# Patient Record
Sex: Female | Born: 1970 | Race: White | Hispanic: No | Marital: Married | Smoking: Never smoker
Health system: Southern US, Community
[De-identification: ages and names within clinical notes are randomized; demographics above are authoritative.]

## PROBLEM LIST (undated history)

## (undated) DIAGNOSIS — N390 Urinary tract infection, site not specified: Secondary | ICD-10-CM

## (undated) DIAGNOSIS — N83209 Unspecified ovarian cyst, unspecified side: Secondary | ICD-10-CM

## (undated) HISTORY — PX: TONSILLECTOMY: SUR1361

## (undated) HISTORY — PX: FRACTURE SURGERY: SHX138

## (undated) HISTORY — PX: WISDOM TOOTH EXTRACTION: SHX21

## (undated) HISTORY — PX: TUBAL LIGATION: SHX77

---

## 1997-12-25 ENCOUNTER — Other Ambulatory Visit: Admission: RE | Admit: 1997-12-25 | Discharge: 1997-12-25 | Payer: Self-pay | Admitting: Obstetrics and Gynecology

## 2000-03-18 ENCOUNTER — Inpatient Hospital Stay (HOSPITAL_COMMUNITY): Admission: AD | Admit: 2000-03-18 | Discharge: 2000-03-18 | Payer: Self-pay | Admitting: Obstetrics and Gynecology

## 2000-03-25 ENCOUNTER — Inpatient Hospital Stay (HOSPITAL_COMMUNITY): Admission: AD | Admit: 2000-03-25 | Discharge: 2000-03-27 | Payer: Self-pay | Admitting: Obstetrics and Gynecology

## 2001-05-24 ENCOUNTER — Other Ambulatory Visit: Admission: RE | Admit: 2001-05-24 | Discharge: 2001-05-24 | Payer: Self-pay | Admitting: Obstetrics and Gynecology

## 2002-01-07 ENCOUNTER — Encounter: Payer: Self-pay | Admitting: Obstetrics and Gynecology

## 2002-01-07 ENCOUNTER — Ambulatory Visit (HOSPITAL_COMMUNITY): Admission: RE | Admit: 2002-01-07 | Discharge: 2002-01-07 | Payer: Self-pay | Admitting: Obstetrics and Gynecology

## 2002-01-09 ENCOUNTER — Inpatient Hospital Stay (HOSPITAL_COMMUNITY): Admission: AD | Admit: 2002-01-09 | Discharge: 2002-01-11 | Payer: Self-pay | Admitting: Obstetrics & Gynecology

## 2002-08-31 ENCOUNTER — Emergency Department (HOSPITAL_COMMUNITY): Admission: EM | Admit: 2002-08-31 | Discharge: 2002-08-31 | Payer: Self-pay | Admitting: Emergency Medicine

## 2002-08-31 ENCOUNTER — Encounter: Payer: Self-pay | Admitting: Emergency Medicine

## 2004-03-04 ENCOUNTER — Other Ambulatory Visit: Admission: RE | Admit: 2004-03-04 | Discharge: 2004-03-04 | Payer: Self-pay | Admitting: Obstetrics & Gynecology

## 2004-03-05 ENCOUNTER — Other Ambulatory Visit: Admission: RE | Admit: 2004-03-05 | Discharge: 2004-03-05 | Payer: Self-pay | Admitting: Obstetrics & Gynecology

## 2004-08-07 ENCOUNTER — Other Ambulatory Visit: Admission: RE | Admit: 2004-08-07 | Discharge: 2004-08-07 | Payer: Self-pay | Admitting: Obstetrics & Gynecology

## 2004-10-06 ENCOUNTER — Encounter (INDEPENDENT_AMBULATORY_CARE_PROVIDER_SITE_OTHER): Payer: Self-pay | Admitting: Specialist

## 2004-10-06 ENCOUNTER — Inpatient Hospital Stay (HOSPITAL_COMMUNITY): Admission: AD | Admit: 2004-10-06 | Discharge: 2004-10-07 | Payer: Self-pay | Admitting: Obstetrics & Gynecology

## 2004-11-04 ENCOUNTER — Other Ambulatory Visit: Admission: RE | Admit: 2004-11-04 | Discharge: 2004-11-04 | Payer: Self-pay | Admitting: Obstetrics & Gynecology

## 2004-11-05 ENCOUNTER — Other Ambulatory Visit: Admission: RE | Admit: 2004-11-05 | Discharge: 2004-11-05 | Payer: Self-pay | Admitting: Obstetrics & Gynecology

## 2009-10-10 ENCOUNTER — Encounter: Admission: RE | Admit: 2009-10-10 | Discharge: 2009-10-10 | Payer: Self-pay | Admitting: Obstetrics & Gynecology

## 2010-08-25 ENCOUNTER — Encounter: Payer: Self-pay | Admitting: Obstetrics & Gynecology

## 2010-12-20 NOTE — Op Note (Signed)
NAMESHANLEY, FURLOUGH NO.:  [id number]   MEDICAL RECORD NO.:  [id number]          PATIENT TYPE:  INP   LOCATION:  9106                          FACILITY:  [facility]   PHYSICIAN:  Ilda Mori, M.D.   DATE OF BIRTH:  [birth year]   DATE OF PROCEDURE:  10/06/2004  DATE OF DISCHARGE:                                 OPERATIVE REPORT   PREOPERATIVE DIAGNOSIS:  Voluntary sterilization.   POSTOPERATIVE DIAGNOSIS:  Voluntary sterilization.   PROCEDURE:  Postpartum bilateral partial salpingectomy for sterilization.   SURGEON:  Ilda Mori, M.D.   ANESTHESIA:  Epidural.   ESTIMATED BLOOD LOSS:  Minimal.   FINDINGS:  Normal appearing tubes and ovaries.   SPECIMENS:  Portions of the right and left fallopian tube.   INDICATIONS FOR PROCEDURE:  This is a 40 year old gravida 5, para 5, who was  one-hour status post spontaneous vaginal delivery, who requested permanent  sterilization.  The failure rate of 2 per 1000 was explained to the patient  as well as the fact that this was a permanent procedure and that there were  non-permanent alternatives available to her.  With this understanding, the  patient elected to proceed.   DESCRIPTION OF PROCEDURE:  The patient was taken to the operating room where  the epidural anesthesia that had been placed for labor pain control was  reinjected for surgical anesthetic.  The abdomen was prepped and draped in  the usual sterile fashion.  A subumbilical transverse incision was made and  carried down to the fascia which was entered sharply and extended bluntly.  The peritoneum was entered bluntly.  The fallopian tube on the right was  grasped with a Babcock clamp, trace to its fimbriated end.  The isthmic-  ampullary junction was then elevated and a knuckle of tube was doubly  ligated at the base and excised for pathologic confirmation.  Identical  procedure was then carried out on the left fallopian tube.  Hemostasis was  noted to be  present.  The subumbilical fascia was closed with a running 0  Vicryl suture and the skin was closed with a subcuticular 4-0 Dexon suture.  The patient tolerated the procedure well and left the operating room in good  condition.      RK/MEDQ  D:  10/06/2004  T:  10/07/2004  Job:  [id number]

## 2010-12-20 NOTE — H&P (Signed)
[facility]  Patient:    Marilyn Holmes, Marilyn Holmes                   MRN: [id number] Adm. Date:  [id number] Attending:  Silverio Lay A                         History and Physical  REASON FOR ADMISSION:         Intrauterine pregnancy at 40 weeks and 4 days, spontaneous labor.  HISTORY OF PRESENT ILLNESS:   This is a 40 year old married white female, gravida 3, para 2, abortus 0, with a due date of March 21, 2000, being admitted at 40 weeks and 4 days for regular contractions which had started irregularly on March 24, 2000 around 11 p.m. and continued progressively to become really regular since 3 a.m. this morning.  She denies any symptoms of pregnancy-induced hypertension, reports good fetal activity; she also denies bleeding or leakage of fluid, but reports some bloody show.  Labor progressed rapidly and after epidural, patient went from 4 to 5 cm to completely dilated. Fetal heart rate is reviewed and reassuring.  PRENATAL COURSE:              Blood type A-positive.  RPR nonreactive.  HBsAg negative.  HIV nonreactive.  Pap smear normal.  Gonorrhea and Chlamydia negative.  Rubella titers equivocal.  Sixteen-week AFP within normal limits. Twenty-week ultrasound with a normal anatomy survey and an anterior placenta. Twenty-eight-week glucose tolerance test within normal limits. Thirty-five-week group B strep negative.  Prenatal course was otherwise uneventful besides a mild iron deficiency anemia treated with iron supplement.  ALLERGIES:                    No known drug allergies.  PAST MEDICAL HISTORY:         March 1991, spontaneous vaginal delivery of a female infant at 40 weeks weighing 7 pounds 5 ounces, no complication. October 1993, spontaneous vaginal delivery of a female infant at 41 weeks weighing 8 pounds 7 ounces, no complication.  History of abnormal Pap smear in May 1999.  FAMILY HISTORY:               Father with chronic hypertension  and strokes. Mother with chronic hypertension and history of deep venous thrombosis on birth control pills and adult-onset diabetes.  Mother with breast cancer diagnosed at age 78; also had stomach cancer.  Brother with chronic hypertension, type adult-onset diabetes, colon cancer diagnosed at age 77.  SOCIAL HISTORY:               Married, nonsmoker.  Works at [employer].  PHYSICAL EXAMINATION  GENERAL:                      Under epidural, comfortable, no acute distress.  VITAL SIGNS:                  Normal.  HEENT:                        Normal.  LUNGS:                        Clear.  HEART:                        Normal.  ABDOMEN:  Gravid and nontender.  Vertex presentation.  PELVIC:                       Vaginal exam:  Completely dilated, completely effaced, bulging bag of water, vertex presentation, station 0.  Artificial rupture of membranes leading to abundant clear amniotic fluid.  EXTREMITIES:                  Negative.  ASSESSMENT:                   Intrauterine pregnancy at 40 weeks and 4 days,                               imminent delivery.  PLAN:                         Spontaneous vaginal delivery expected. DD:  03/25/00 TD:  03/25/00 Job: [id number]

## 2011-08-15 ENCOUNTER — Inpatient Hospital Stay (HOSPITAL_COMMUNITY): Payer: BC Managed Care – PPO

## 2011-08-15 ENCOUNTER — Inpatient Hospital Stay (HOSPITAL_COMMUNITY)
Admission: AD | Admit: 2011-08-15 | Discharge: 2011-08-15 | Disposition: A | Discharge status: Home / Self Care | Payer: BC Managed Care – PPO | Source: Ambulatory Visit | Attending: Obstetrics and Gynecology | Admitting: Obstetrics and Gynecology

## 2011-08-15 ENCOUNTER — Encounter (HOSPITAL_COMMUNITY): Payer: Self-pay | Admitting: *Deleted

## 2011-08-15 DIAGNOSIS — N949 Unspecified condition associated with female genital organs and menstrual cycle: Secondary | ICD-10-CM | POA: Insufficient documentation

## 2011-08-15 HISTORY — DX: Unspecified ovarian cyst, unspecified side: N83.209

## 2011-08-15 HISTORY — DX: Urinary tract infection, site not specified: N39.0

## 2011-08-15 LAB — CBC
HCT: 34.8 % — ABNORMAL LOW (ref 36.0–46.0)
Hemoglobin: 11.8 g/dL — ABNORMAL LOW (ref 12.0–15.0)
MCH: 28.8 pg (ref 26.0–34.0)
MCHC: 33.9 g/dL (ref 30.0–36.0)
MCV: 84.9 fL (ref 78.0–100.0)
Platelets: 198 10*3/uL (ref 150–400)
RBC: 4.1 MIL/uL (ref 3.87–5.11)
RDW: 12.8 % (ref 11.5–15.5)
WBC: 8.7 10*3/uL (ref 4.0–10.5)

## 2011-08-15 LAB — URINALYSIS, ROUTINE W REFLEX MICROSCOPIC
Bilirubin Urine: NEGATIVE
Ketones, ur: NEGATIVE mg/dL
Leukocytes, UA: NEGATIVE
Nitrite: NEGATIVE
Urobilinogen, UA: 0.2 mg/dL (ref 0.0–1.0)
pH: 7 (ref 5.0–8.0)

## 2011-08-15 NOTE — ED Notes (Signed)
Has been having some tenderness with intercourse, esp if deep.   Same partner 18yrs.

## 2011-08-15 NOTE — Progress Notes (Addendum)
Last night after intercourse, had Internal pain- like  labor, shooting back toward rectum.  Now still having cramping.

## 2011-08-15 NOTE — ED Provider Notes (Signed)
Pt is a 41 year old white female who presents to the ER c/o severe pelvic pain which began after sex last pm. Pt had a similar occurrence six months ago. Pt has also been having trouble with recurrent BV. Pt uses a BTL for [facility]. Her periods are regular but heavy. She denies any discharge today. Has some pain with voiding. Bowel function is normal.  PE: VSSAF        Abd- soft, nontender, no masses, no guarding.        Pelvic- ext-wnl                    Vagina- scant discharge                    Cx- +cmt                     Uterus-tender, appears normal size and shape.                    Adnexa- no masses. IMP/ pelvic pain         Possible etiologies- ruptured ovarian cyst, std Plan/ Check ultrasound, cbc,ct/gc, ua          Cleocin 150 tid x 5

## 2011-08-15 NOTE — ED Notes (Signed)
Prescription for Cleocin 150mg  TID x5 days (as listed in MD documentation) called in to pt's pharmacy choice- [facility] as listed.

## 2011-08-15 NOTE — Progress Notes (Signed)
Pt

## 2011-08-16 LAB — GC/CHLAMYDIA PROBE AMP, GENITAL
Chlamydia, DNA Probe: NEGATIVE
GC Probe Amp, Genital: NEGATIVE

## 2013-01-18 ENCOUNTER — Other Ambulatory Visit: Payer: Self-pay

## 2014-06-05 ENCOUNTER — Encounter (HOSPITAL_COMMUNITY): Payer: Self-pay | Admitting: *Deleted

## 2015-08-16 ENCOUNTER — Emergency Department (HOSPITAL_COMMUNITY)
Admission: EM | Admit: 2015-08-16 | Discharge: 2015-08-16 | Disposition: A | Discharge status: Home / Self Care | Payer: BLUE CROSS/BLUE SHIELD | Attending: Emergency Medicine | Admitting: Emergency Medicine

## 2015-08-16 ENCOUNTER — Emergency Department (HOSPITAL_COMMUNITY): Payer: BLUE CROSS/BLUE SHIELD

## 2015-08-16 DIAGNOSIS — R509 Fever, unspecified: Secondary | ICD-10-CM | POA: Diagnosis not present

## 2015-08-16 DIAGNOSIS — Z8742 Personal history of other diseases of the female genital tract: Secondary | ICD-10-CM | POA: Insufficient documentation

## 2015-08-16 DIAGNOSIS — Z87440 Personal history of urinary (tract) infections: Secondary | ICD-10-CM | POA: Diagnosis not present

## 2015-08-16 DIAGNOSIS — R1011 Right upper quadrant pain: Secondary | ICD-10-CM | POA: Insufficient documentation

## 2015-08-16 LAB — CBC
HEMATOCRIT: 33.1 % — AB (ref 36.0–46.0)
HEMOGLOBIN: 11.1 g/dL — AB (ref 12.0–15.0)
MCH: 28.9 pg (ref 26.0–34.0)
MCHC: 33.5 g/dL (ref 30.0–36.0)
MCV: 86.2 fL (ref 78.0–100.0)
Platelets: 188 10*3/uL (ref 150–400)
RBC: 3.84 MIL/uL — AB (ref 3.87–5.11)
RDW: 13 % (ref 11.5–15.5)
WBC: 9.3 10*3/uL (ref 4.0–10.5)

## 2015-08-16 LAB — URINALYSIS, ROUTINE W REFLEX MICROSCOPIC
Bilirubin Urine: NEGATIVE
GLUCOSE, UA: NEGATIVE mg/dL
Hgb urine dipstick: NEGATIVE
Ketones, ur: NEGATIVE mg/dL
LEUKOCYTES UA: NEGATIVE
Nitrite: NEGATIVE
PH: 7.5 (ref 5.0–8.0)
Protein, ur: NEGATIVE mg/dL
SPECIFIC GRAVITY, URINE: 1.01 (ref 1.005–1.030)

## 2015-08-16 LAB — COMPREHENSIVE METABOLIC PANEL
ALT: 28 U/L (ref 14–54)
ANION GAP: 8 (ref 5–15)
AST: 22 U/L (ref 15–41)
Albumin: 3.8 g/dL (ref 3.5–5.0)
Alkaline Phosphatase: 93 U/L (ref 38–126)
BUN: 10 mg/dL (ref 6–20)
CHLORIDE: 105 mmol/L (ref 101–111)
CO2: 24 mmol/L (ref 22–32)
Calcium: 9 mg/dL (ref 8.9–10.3)
Creatinine, Ser: 0.7 mg/dL (ref 0.44–1.00)
GFR calc Af Amer: >60 mL/min (ref >60)
Glucose, Bld: 109 mg/dL — ABNORMAL HIGH (ref 65–99)
POTASSIUM: 3.7 mmol/L (ref 3.5–5.1)
Sodium: 137 mmol/L (ref 135–145)
Total Bilirubin: 0.5 mg/dL (ref 0.3–1.2)
Total Protein: 6.9 g/dL (ref 6.5–8.1)

## 2015-08-16 LAB — LIPASE, BLOOD: LIPASE: 23 U/L (ref 11–51)

## 2015-08-16 NOTE — ED Notes (Signed)
Brought patient back to room 

## 2015-08-16 NOTE — ED Provider Notes (Signed)
CSN: [id number]     Arrival date & time 08/16/15  1050 History  By signing my name below, I, Marilyn Holmes, attest that this documentation has been prepared under the direction and in the presence of Mohawk IndustriesJeff Ariston Grandison, PA-C. Electronically Signed: Tanda RockersMargaux Holmes, ED Scribe. 08/16/2015. 2:38 PM.   Chief Complaint  Patient presents with  . Abdominal Pain   The history is provided by the patient. No language interpreter was used.     HPI Comments: Marilyn Holmes is a 45 y.o. female who presents to the Emergency Department complaining of gradual onset, intermittent , cramping, RUQ pain x 2 days, worsening this morning. The pain is exacerbated with lying flat and stretching her abdomen. She cannot say if the pain is worse after eating foods because she has not paid much attention to it. She has been taking Advil and applying heat with some relief. The last time she took Advil was around 2 AM this morning (approximately 12.5 hours ago). Pt has never had these symptoms in the past. She does report that before her symptoms began she attempted to lift something heavy at work but was unable to due so because of the weight of the object. Pt woke up the next morning with the pain. She was seen at Urgent Care earlier today for her symptoms. Pt had a CXR done there with no acute findings and was told to come to the ED for further evaluation. She mentions having a fever of 100.2 at Urgent Care. She was not given anything for her fever but her temperature in the ED today is 98.9. Denies indigestion, nausea, vomiting, diarrhea, constipation, chest pain, shortness of breath, or any other associated symptoms.   Past Medical History  Diagnosis Date  . Urinary tract infection   . Ovarian cyst    Past Surgical History  Procedure Laterality Date  . Tubal ligation    . Fracture surgery      repair of arm fx (bilateral), fr nose  . Tonsillectomy    . Wisdom tooth extraction     Family History  Problem Relation Age of  Onset  . Anesthesia problems Neg Hx    Social History  Substance Use Topics  . Smoking status: Never Smoker   . Smokeless tobacco: Never Used  . Alcohol Use: Yes   OB History    Gravida Para Term Preterm AB TAB SAB Ectopic Multiple Living   5 5 5  0 0 0 0 0 0 5     Review of Systems  All other systems reviewed and are negative.  Allergies  Review of patient's allergies indicates no known allergies.  Home Medications   Prior to Admission medications   Medication Sig Start Date End Date Taking? Authorizing Provider  ibuprofen (ADVIL,MOTRIN) 600 MG tablet Take 600 mg by mouth every 6 (six) hours as needed. Patient took this medication for pain.    Historical Provider, MD  penicillin v potassium (VEETID) 500 MG tablet Take 500 mg by mouth 4 (four) times daily. Patient is using this medication for an extracted wisdom tooth.    Historical Provider, MD  Pseudoeph-Doxylamine-DM-APAP (NYQUIL PO) Take 5 mLs by mouth daily as needed. Patient was using this medication for cold.    Historical Provider, MD   Triage Vitals:  BP 126/87 mmHg  Pulse 91  Temp(Src) 98.9 F (37.2 C) (Oral)  Resp 20  Ht 5\' 9"  (1.753 m)  Wt 160 lb 4.8 oz (72.712 kg)  BMI 23.66 kg/m2  SpO2  100%   Physical Exam  Constitutional: She is oriented to person, place, and time. She appears well-developed and well-nourished. No distress.  HENT:  Head: Normocephalic and atraumatic.  Eyes: Conjunctivae and EOM are normal.  Neck: Neck supple. No tracheal deviation present.  Cardiovascular: Normal rate and regular rhythm.   Pulmonary/Chest: Effort normal. No respiratory distress.  Abdominal: Soft. She exhibits no distension. There is tenderness. There is no rebound and no guarding.  Very minor tenderness to palpation at the RUQ No Murphy's sign No signs of trauma No warmth or redness No masses felt No CVA tenderness  Musculoskeletal: Normal range of motion.  Neurological: She is alert and oriented to person, place,  and time.  Skin: Skin is warm and dry.  Psychiatric: She has a normal mood and affect. Her behavior is normal.  Nursing note and vitals reviewed.   ED Course  Procedures (including critical care time)  DIAGNOSTIC STUDIES: Oxygen Saturation is 100% on RA, normal by my interpretation.    COORDINATION OF CARE: 2:30 PM-Discussed treatment plan which includes referral to GI with pt at bedside and pt agreed to plan.   Labs Review Labs Reviewed  COMPREHENSIVE METABOLIC PANEL - Abnormal; Notable for the following:    Glucose, Bld 109 (*)    All other components within normal limits  CBC - Abnormal; Notable for the following:    RBC 3.84 (*)    Hemoglobin 11.1 (*)    HCT 33.1 (*)    All other components within normal limits  LIPASE, BLOOD  URINALYSIS, ROUTINE W REFLEX MICROSCOPIC (NOT AT [facility])    Imaging Review US Abdomen Limited  08/16/2015  CLINICAL DATA:  Two day history of right upper quadrant pain EXAM: US ABDOMEN LIMITED - RIGHT UPPER QUADRANT COMPARISON:  None. FINDINGS: Gallbladder: No gallstones or wall thickening visualized. There is no pericholecystic fluid. No sonographic Murphy sign noted by sonographer. Common bile duct: Diameter: 5 mm. There is no intrahepatic or extrahepatic biliary duct dilatation. Liver: There is a cyst in the medial segment of the left lobe of the liver measuring 2.8 x 2.4 x 2.3 cm. Within normal limits in parenchymal echogenicity. IMPRESSION: Cyst in left lobe of liver.  Study otherwise unremarkable. Electronically Signed   By: Bretta Bang III M.D.   On: 08/16/2015 13:57   I have personally reviewed and evaluated these images and lab results as part of my medical decision-making.   EKG Interpretation None      MDM   Final diagnoses:  Right upper quadrant pain   Labs: Lipase, CMP, CBC, and UA  Imaging: US Abdomen  Consults: None  Therapeutics: None  Discharge Meds:   Assessment/Plan:  45 year old female presents today with  right upper quadrant pain. Patient is afebrile, she has no signs of biliary involvement on ultrasound or laboratory analysis. She is nontoxic, tolerating fluids without difficulty. Uncertain if this is muscular in nature from patient's heavy lifting or biliary colic. Patient will be instructed to use ibuprofen or Tylenol as needed for discomfort, heat, stretching her abdomen, and follow up with gastroenterology next week if symptoms continue to persist. Patient was given strict return precautions the event new or worsening signs or symptoms present, she verbalized understanding and agreement to today's plan and had no further questions or concerns at the time of discharge. Patient incidentally had a small cyst on the left lobe of the liver, this was within normal limits in parenchymal echogenicity. Patient was made aware of these findings, and if  symptoms continue to persist she will need to relay this information on to her gastroenterologist.     I personally performed the services described in this documentation, which was scribed in my presence. The recorded information has been reviewed and is accurate.      Eyvonne Mechanic, PA-C 08/16/15 1446  Loren Racer, MD 08/16/15 [phone]

## 2015-08-16 NOTE — ED Notes (Signed)
Pt reports RUQ abdomainal pain hat has been ongoing for 2 days. Pt denies N/V. [facility]

## 2015-08-16 NOTE — Discharge Instructions (Signed)
Biliary Colic °Biliary colic is a pain in the upper abdomen. The pain: °· Is usually felt on the right side of the abdomen, but it may also be felt in the center of the abdomen, just below the breastbone (sternum). °· May spread back toward the right shoulder blade. °· May be steady or irregular. °· May be accompanied by nausea and vomiting. °Most of the time, the pain goes away in 1-5 hours. After the most intense pain passes, the abdomen may continue to ache mildly for about 24 hours. °Biliary colic is caused by a blockage in the bile duct. The bile duct is a pathway that carries bile--a liquid that helps to digest fats--from the gallbladder to the small intestine. Biliary colic usually occurs after eating, when the digestive system demands bile. The pain develops when muscle cells contract forcefully to try to move the blockage so that bile can get by. °HOME CARE INSTRUCTIONS °· Take medicines only as directed by your health care provider. °· Drink enough fluid to keep your urine clear or pale yellow. °· Avoid fatty, greasy, and fried foods. These kinds of foods increase your body's demand for bile. °· Avoid any foods that make your pain worse. °· Avoid overeating. °· Avoid having a large meal after fasting. °SEEK MEDICAL CARE IF: °· You develop a fever. °· Your pain gets worse. °· You vomit. °· You develop nausea that prevents you from eating and drinking. °SEEK IMMEDIATE MEDICAL CARE IF: °· You suddenly develop a fever and shaking chills. °· You develop a yellowish discoloration (jaundice) of: °¨ Skin. °¨ Whites of the eyes. °¨ Mucous membranes. °· You have continuous or severe pain that is not relieved with medicines. °· You have nausea and vomiting that is not relieved with medicines. °· You develop dizziness or you faint. °  °This information is not intended to replace advice given to you by your health care provider. Make sure you discuss any questions you have with your health care provider. °  °Document  Released: 12/22/2005 Document Revised: 12/05/2014 Document Reviewed: 05/02/2014 °Elsevier Interactive Patient Education ©2016 Elsevier Inc. ° °

## 2017-01-30 ENCOUNTER — Other Ambulatory Visit: Payer: Self-pay | Admitting: Obstetrics & Gynecology

## 2017-01-30 DIAGNOSIS — R928 Other abnormal and inconclusive findings on diagnostic imaging of breast: Secondary | ICD-10-CM

## 2017-02-03 ENCOUNTER — Other Ambulatory Visit: Payer: Self-pay | Admitting: Obstetrics & Gynecology

## 2017-02-03 ENCOUNTER — Ambulatory Visit
Admission: RE | Admit: 2017-02-03 | Discharge: 2017-02-03 | Disposition: A | Discharge status: Home / Self Care | Payer: BLUE CROSS/BLUE SHIELD | Source: Ambulatory Visit | Attending: Obstetrics & Gynecology | Admitting: Obstetrics & Gynecology

## 2017-02-03 ENCOUNTER — Encounter (INDEPENDENT_AMBULATORY_CARE_PROVIDER_SITE_OTHER): Payer: Self-pay

## 2017-02-03 DIAGNOSIS — N6320 Unspecified lump in the left breast, unspecified quadrant: Secondary | ICD-10-CM

## 2017-02-03 DIAGNOSIS — R928 Other abnormal and inconclusive findings on diagnostic imaging of breast: Secondary | ICD-10-CM

## 2017-02-06 ENCOUNTER — Ambulatory Visit
Admission: RE | Admit: 2017-02-06 | Discharge: 2017-02-06 | Disposition: A | Discharge status: Home / Self Care | Payer: BLUE CROSS/BLUE SHIELD | Source: Ambulatory Visit | Attending: Obstetrics & Gynecology | Admitting: Obstetrics & Gynecology

## 2017-02-06 ENCOUNTER — Other Ambulatory Visit: Payer: Self-pay | Admitting: Obstetrics & Gynecology

## 2017-02-06 DIAGNOSIS — N6320 Unspecified lump in the left breast, unspecified quadrant: Secondary | ICD-10-CM

## 2017-02-09 HISTORY — PX: BREAST BIOPSY: SHX20

## 2017-02-18 ENCOUNTER — Emergency Department (HOSPITAL_COMMUNITY): Payer: BLUE CROSS/BLUE SHIELD

## 2017-02-18 ENCOUNTER — Encounter (HOSPITAL_COMMUNITY): Payer: Self-pay | Admitting: Emergency Medicine

## 2017-02-18 ENCOUNTER — Observation Stay (HOSPITAL_COMMUNITY)
Admission: EM | Admit: 2017-02-18 | Discharge: 2017-02-19 | Disposition: A | Discharge status: Home / Self Care | Payer: BLUE CROSS/BLUE SHIELD | Attending: Internal Medicine | Admitting: Internal Medicine

## 2017-02-18 DIAGNOSIS — R609 Edema, unspecified: Secondary | ICD-10-CM | POA: Diagnosis not present

## 2017-02-18 DIAGNOSIS — I10 Essential (primary) hypertension: Secondary | ICD-10-CM | POA: Insufficient documentation

## 2017-02-18 DIAGNOSIS — I159 Secondary hypertension, unspecified: Secondary | ICD-10-CM | POA: Diagnosis not present

## 2017-02-18 DIAGNOSIS — W57XXXA Bitten or stung by nonvenomous insect and other nonvenomous arthropods, initial encounter: Secondary | ICD-10-CM | POA: Diagnosis not present

## 2017-02-18 DIAGNOSIS — T63461A Toxic effect of venom of wasps, accidental (unintentional), initial encounter: Secondary | ICD-10-CM | POA: Diagnosis not present

## 2017-02-18 DIAGNOSIS — L0390 Cellulitis, unspecified: Secondary | ICD-10-CM | POA: Diagnosis present

## 2017-02-18 DIAGNOSIS — L03114 Cellulitis of left upper limb: Secondary | ICD-10-CM | POA: Insufficient documentation

## 2017-02-18 DIAGNOSIS — L530 Toxic erythema: Secondary | ICD-10-CM | POA: Diagnosis not present

## 2017-02-18 LAB — CBC WITH DIFFERENTIAL/PLATELET
Basophils Absolute: 0 10*3/uL (ref 0.0–0.1)
Basophils Relative: 0 %
Eosinophils Absolute: 0.2 10*3/uL (ref 0.0–0.7)
Eosinophils Relative: 3 %
HEMATOCRIT: 38.2 % (ref 36.0–46.0)
HEMOGLOBIN: 12.6 g/dL (ref 12.0–15.0)
LYMPHS ABS: 1.8 10*3/uL (ref 0.7–4.0)
LYMPHS PCT: 24 %
MCH: 27 pg (ref 26.0–34.0)
MCHC: 33 g/dL (ref 30.0–36.0)
MCV: 82 fL (ref 78.0–100.0)
MONOS PCT: 7 %
Monocytes Absolute: 0.6 10*3/uL (ref 0.1–1.0)
NEUTROS PCT: 66 %
Neutro Abs: 4.9 10*3/uL (ref 1.7–7.7)
Platelets: 197 10*3/uL (ref 150–400)
RBC: 4.66 MIL/uL (ref 3.87–5.11)
RDW: 14.5 % (ref 11.5–15.5)
WBC: 7.6 10*3/uL (ref 4.0–10.5)

## 2017-02-18 LAB — BASIC METABOLIC PANEL
Anion gap: 8 (ref 5–15)
BUN: 19 mg/dL (ref 6–20)
CHLORIDE: 105 mmol/L (ref 101–111)
CO2: 26 mmol/L (ref 22–32)
Calcium: 9.2 mg/dL (ref 8.9–10.3)
Creatinine, Ser: 0.76 mg/dL (ref 0.44–1.00)
GFR calc non Af Amer: >60 mL/min (ref >60)
Glucose, Bld: 102 mg/dL — ABNORMAL HIGH (ref 65–99)
POTASSIUM: 3.8 mmol/L (ref 3.5–5.1)
Sodium: 139 mmol/L (ref 135–145)

## 2017-02-18 LAB — LACTIC ACID, PLASMA: Lactic Acid, Venous: 1.2 mmol/L (ref 0.5–1.9)

## 2017-02-18 MED ORDER — IBUPROFEN 400 MG PO TABS
400.0000 mg | ORAL_TABLET | Freq: Four times a day (QID) | ORAL | Status: DC
  Administered 2017-02-18 – 2017-02-19 (×4): 400 mg via ORAL
  Filled 2017-02-18 (×4): qty 1

## 2017-02-18 MED ORDER — TETANUS-DIPHTH-ACELL PERTUSSIS 5-2.5-18.5 LF-MCG/0.5 IM SUSP
0.5000 mL | Freq: Once | INTRAMUSCULAR | Status: AC
  Administered 2017-02-18: 0.5 mL via INTRAMUSCULAR
  Filled 2017-02-18: qty 0.5

## 2017-02-18 MED ORDER — PANTOPRAZOLE SODIUM 40 MG PO TBEC
40.0000 mg | DELAYED_RELEASE_TABLET | Freq: Every day | ORAL | Status: DC
  Administered 2017-02-18 – 2017-02-19 (×2): 40 mg via ORAL
  Filled 2017-02-18 (×2): qty 1

## 2017-02-18 MED ORDER — DEXTROSE 5 % IV SOLN
1.0000 g | INTRAVENOUS | Status: DC
  Administered 2017-02-18: 1 g via INTRAVENOUS
  Filled 2017-02-18 (×3): qty 10

## 2017-02-18 MED ORDER — SODIUM CHLORIDE 0.9 % IV SOLN
INTRAVENOUS | Status: DC
  Administered 2017-02-18: 11:00:00 via INTRAVENOUS

## 2017-02-18 MED ORDER — ACETAMINOPHEN 650 MG RE SUPP: 650.0000 mg | Freq: Four times a day (QID) | RECTAL | Status: DC | PRN

## 2017-02-18 MED ORDER — CRANBERRY/PROBIOTIC/VIT C 450-30 MG PO TABS: ORAL_TABLET | Freq: Every day | ORAL | Status: DC

## 2017-02-18 MED ORDER — ENOXAPARIN SODIUM 40 MG/0.4ML ~~LOC~~ SOLN
40.0000 mg | SUBCUTANEOUS | Status: DC
  Administered 2017-02-18: 40 mg via SUBCUTANEOUS
  Filled 2017-02-18: qty 0.4

## 2017-02-18 MED ORDER — ONDANSETRON HCL 4 MG/2ML IJ SOLN: 4.0000 mg | Freq: Four times a day (QID) | INTRAMUSCULAR | Status: DC | PRN

## 2017-02-18 MED ORDER — DIPHENHYDRAMINE HCL 25 MG PO CAPS
25.0000 mg | ORAL_CAPSULE | Freq: Three times a day (TID) | ORAL | Status: DC
  Administered 2017-02-18 – 2017-02-19 (×3): 25 mg via ORAL
  Filled 2017-02-18 (×3): qty 1

## 2017-02-18 MED ORDER — ONDANSETRON HCL 4 MG PO TABS: 4.0000 mg | ORAL_TABLET | Freq: Four times a day (QID) | ORAL | Status: DC | PRN

## 2017-02-18 MED ORDER — RISAQUAD PO CAPS
1.0000 | ORAL_CAPSULE | Freq: Every day | ORAL | Status: DC
  Administered 2017-02-18 – 2017-02-19 (×2): 1 via ORAL
  Filled 2017-02-18 (×2): qty 1

## 2017-02-18 MED ORDER — ACETAMINOPHEN 325 MG PO TABS
650.0000 mg | ORAL_TABLET | Freq: Four times a day (QID) | ORAL | Status: DC | PRN
Start: 2017-02-18 — End: 2017-02-19

## 2017-02-18 MED ORDER — CLINDAMYCIN PHOSPHATE 600 MG/50ML IV SOLN
600.0000 mg | Freq: Once | INTRAVENOUS | Status: AC
  Administered 2017-02-18: 600 mg via INTRAVENOUS
  Filled 2017-02-18: qty 50

## 2017-02-18 NOTE — ED Provider Notes (Signed)
[facility]-EMERGENCY DEPT Provider Note   CSN: [id number] Arrival date & time: 02/18/17  [id number]     History   Chief Complaint Chief Complaint  Patient presents with  . Insect Bite    HPI Marilyn Holmes is a 46 y.o. female.  HPI  Pt was seen at 0725. Per pt, c/o gradual onset and worsening of persistent left hand "swelling" for the past 2 days. Has been associated with "red rash." States her symptoms began after she was "stung by 4 wasps" 2 days ago. Pt states initially her left dorsal hand was "red and swollen" and today the swelling and redness is streaking up her left arm to her elbow. Denies fevers, no focal motor weakness, no tinging/numbness in extremities, no open wounds.    Pt is right handed Td unk Past Medical History:  Diagnosis Date  . Ovarian cyst   . Urinary tract infection     There are no active problems to display for this patient.   Past Surgical History:  Procedure Laterality Date  . FRACTURE SURGERY     repair of arm fx (bilateral), fr nose  . TONSILLECTOMY    . TUBAL LIGATION    . WISDOM TOOTH EXTRACTION      OB History    Gravida Para Term Preterm AB Living   5 5 5  0 0 5   SAB TAB Ectopic Multiple Live Births   0 0 0 0         Home Medications    Prior to Admission medications   Medication Sig Start Date End Date Taking? Authorizing Provider  ibuprofen (ADVIL,MOTRIN) 600 MG tablet Take 600 mg by mouth every 6 (six) hours as needed. Patient took this medication for pain.    [provider]  penicillin v potassium (VEETID) 500 MG tablet Take 500 mg by mouth 4 (four) times daily. Patient is using this medication for an extracted wisdom tooth.    [provider]  Pseudoeph-Doxylamine-DM-APAP (NYQUIL PO) Take 5 mLs by mouth daily as needed. Patient was using this medication for cold.    [provider]    Family History Family History  Problem Relation Age of Onset  . Anesthesia problems Neg Hx     Social  History Social History  Substance Use Topics  . Smoking status: Never Smoker  . Smokeless tobacco: Never Used  . Alcohol use Yes     Comment: weekend     Allergies   Patient has no known allergies.   Review of Systems Review of Systems ROS: Statement: All systems negative except as marked or noted in the HPI; Constitutional: Negative for fever and chills. ; ; Eyes: Negative for eye pain, redness and discharge. ; ; ENMT: Negative for ear pain, hoarseness, nasal congestion, sinus pressure and sore throat. ; ; Cardiovascular: Negative for chest pain, palpitations, diaphoresis, dyspnea and peripheral edema. ; ; Respiratory: Negative for cough, wheezing and stridor. ; ; Gastrointestinal: Negative for nausea, vomiting, diarrhea, abdominal pain, blood in stool, hematemesis, jaundice and rectal bleeding. . ; ; Genitourinary: Negative for dysuria, flank pain and hematuria. ; ; Musculoskeletal: Negative for back pain and neck pain. Negative for swelling and trauma.; ; Skin: +left hand swelling, rash. Negative for pruritus, abrasions, bruising and skin lesion.; ; Neuro: Negative for headache, lightheadedness and neck stiffness. Negative for weakness, altered level of consciousness, altered mental status, extremity weakness, paresthesias, involuntary movement, seizure and syncope.       Physical Exam Updated Vital Signs BP  125/90 (BP Location: Left Arm)   Pulse 77   Temp 97.7 F (36.5 C) (Oral)   Resp 18   Ht 5\' 9"  (1.753 m)   Wt 74.8 kg (165 lb)   LMP 02/16/2017   SpO2 100%   BMI 24.37 kg/m   Physical Exam 0730: Physical examination:  Nursing notes reviewed; Vital signs and O2 SAT reviewed;  Constitutional: Well developed, Well nourished, Well hydrated, In no acute distress; Head:  Normocephalic, atraumatic; Eyes: EOMI, PERRL, No scleral icterus; ENMT: Mouth and pharynx normal, Mucous membranes moist; Neck: Supple, Full range of motion, No lymphadenopathy; Cardiovascular: Regular rate and  rhythm, No gallop; Respiratory: Breath sounds clear & equal bilaterally, No wheezes.  Speaking full sentences with ease, Normal respiratory effort/excursion; Chest: Nontender, Movement normal; Abdomen: Soft, Nontender, Nondistended, Normal bowel sounds; Genitourinary: No CVA tenderness; Extremities: Pulses normal, No deformity. +left dorsal hand edema extending to dorsal wrist, including proximal index finger, with erythema streaking up left dorsal-medial forearm to elbow with one small blister. No open wounds, no drainage, no ecchymosis. No palp fluctuance..; Neuro: AA&Ox3, Major CN grossly intact.  Speech clear. No gross focal motor or sensory deficits in extremities.; Skin: Color normal, Warm, Dry.   ED Treatments / Results  Labs (all labs ordered are listed, but only abnormal results are displayed)   EKG  EKG Interpretation None       Radiology   Procedures Procedures (including critical care time)  Medications Ordered in ED Medications  clindamycin (CLEOCIN) IVPB 600 mg (0 mg Intravenous Stopped 02/18/17 0852)     Initial Impression / Assessment and Plan / ED Course  I have reviewed the triage vital signs and the nursing notes.  Pertinent labs & imaging results that were available during my care of the patient were reviewed by me and considered in my medical decision making (see chart for details).  MDM Reviewed: previous chart, nursing note and vitals Reviewed previous: labs Interpretation: labs and x-ray   Results for orders placed or performed during the hospital encounter of 02/18/17  Basic metabolic panel  Result Value Ref Range   Sodium 139 135 - 145 mmol/L   Potassium 3.8 3.5 - 5.1 mmol/L   Chloride 105 101 - 111 mmol/L   CO2 26 22 - 32 mmol/L   Glucose, Bld 102 (H) 65 - 99 mg/dL   BUN 19 6 - 20 mg/dL   Creatinine, Ser 1.61 0.44 - 1.00 mg/dL   Calcium 9.2 8.9 - 09.6 mg/dL   GFR calc non Af Amer >60 >60 mL/min   GFR calc Af Amer >60 >60 mL/min   Anion gap  8 5 - 15  CBC with Differential  Result Value Ref Range   WBC 7.6 4.0 - 10.5 K/uL   RBC 4.66 3.87 - 5.11 MIL/uL   Hemoglobin 12.6 12.0 - 15.0 g/dL   HCT 04.5 40.9 - 81.1 %   MCV 82.0 78.0 - 100.0 fL   MCH 27.0 26.0 - 34.0 pg   MCHC 33.0 30.0 - 36.0 g/dL   RDW 91.4 78.2 - 95.6 %   Platelets 197 150 - 400 K/uL   Neutrophils Relative % 66 %   Neutro Abs 4.9 1.7 - 7.7 K/uL   Lymphocytes Relative 24 %   Lymphs Abs 1.8 0.7 - 4.0 K/uL   Monocytes Relative 7 %   Monocytes Absolute 0.6 0.1 - 1.0 K/uL   Eosinophils Relative 3 %   Eosinophils Absolute 0.2 0.0 - 0.7 K/uL   Basophils  Relative 0 %   Basophils Absolute 0.0 0.0 - 0.1 K/uL  Lactic acid, plasma  Result Value Ref Range   Lactic Acid, Venous 1.2 0.5 - 1.9 mmol/L   Dg Hand Complete Left Result Date: 02/18/2017 CLINICAL DATA:  Left hand swelling after wasp sting 02/16/2017. Initial encounter. EXAM: LEFT HAND - COMPLETE 3+ VIEW COMPARISON:  None. FINDINGS: Generalized soft tissue swelling. No soft tissue emphysema, opaque foreign body, or osseous abnormality. IMPRESSION: Soft tissue swelling without emphysema, opaque foreign body, or osseous abnormality. Electronically Signed   By: Marnee Spring M.D.   On: 02/18/2017 08:07    0900:  IV clindamycin given. Will update Td. Dx and testing d/w pt.  Questions answered.  Verb understanding, agreeable to observation admit.   T/C to Ortho Dr. Romeo Apple, case discussed, including:  HPI, pertinent PM/SHx, VS/PE, dx testing, ED course and treatment:  Agreeable to consult.   0915:  T/C to [facility] Dr. Ella Jubilee, case discussed, including:  HPI, pertinent PM/SHx, VS/PE, dx testing, ED course and treatment:  Agreeable to admit.   Final Clinical Impressions(s) / ED Diagnoses   Final diagnoses:  None    New Prescriptions New Prescriptions   No medications on file     Samuel Jester, DO 02/23/17 1520

## 2017-02-18 NOTE — H&P (Signed)
History and Physical    Marilyn Holmes ZOX:[id number] DOB: [birth year] DOA: 02/18/2017  PCP: Ilda Mori, MD   Patient coming from: Home   Chief Complaint:  Left hand edema and pain.   HPI: Marilyn Holmes is a 46 y.o. female with no significant medical history, presents with left hand edema. Her symptoms started about 48 hours ago, triggered by wasp stung, are on her index finger on left hand. Her edema has been worsening and expanding into her proximal forearm, associated with erythema and tenderness, worse with movement and touch, no improving factors. The edema has been severe in intensity. Patient tried ibuprofen and Benadryl last night with no significant improvement of her symptoms. Due to persistent edema she presented to the hospital for further evaluation.  ED Course: Patient was evaluated, she received antibiotics, referred for further admission.  Review of Systems: 1. General. No fevers or chills, no waking a weight loss 2. ENT no runny nose or sore throat 3. Pulmonary no shortness of breath cough or hemoptysis 4. Cardiovascular no angina, no claudication no PND orthopnea 5. Gastrointestinal no nausea vomiting or diarrhea 6. Most skeletal no joint pain, positive pain at the left wrist as mentioned in history present illness 7. Hematology no easy bruisability or frequent infections 8. Urology no dysuria or increased urinary frequency 9. Neurology no seizures or paresthesias 10. Endocrine no tremors, heat or cold intolerance  Past Medical History:  Diagnosis Date  . Ovarian cyst   . Urinary tract infection     Past Surgical History:  Procedure Laterality Date  . FRACTURE SURGERY     repair of arm fx (bilateral), fr nose  . TONSILLECTOMY    . TUBAL LIGATION    . WISDOM TOOTH EXTRACTION       reports that she has never smoked. She has never used smokeless tobacco. She reports that she drinks alcohol. She reports that she does not use drugs.  No Known  Allergies  Family History  Problem Relation Age of Onset  . Anesthesia problems Neg Hx      Prior to Admission medications   Medication Sig Start Date End Date Taking? Authorizing Provider  Cranberry-Vit C-Lactobacillus (CRANBERRY/PROBIOTIC/VIT C PO) Take 1 tablet by mouth daily.   Yes [provider]  ibuprofen (ADVIL,MOTRIN) 200 MG tablet Take 200 mg by mouth daily as needed for mild pain.   Yes [provider]    Physical Exam: Vitals:   02/18/17 0640 02/18/17 0938  BP: 125/90 (!) 136/91  Pulse: 77 70  Resp: 18 18  Temp: 97.7 F (36.5 C) 97.7 F (36.5 C)  TempSrc: Oral   SpO2: 100% 100%  Weight: 74.8 kg (165 lb)   Height: 5\' 9"  (1.753 m)     Constitutional: not in pain or dyspnea Vitals:   02/18/17 0640 02/18/17 0938  BP: 125/90 (!) 136/91  Pulse: 77 70  Resp: 18 18  Temp: 97.7 F (36.5 C) 97.7 F (36.5 C)  TempSrc: Oral   SpO2: 100% 100%  Weight: 74.8 kg (165 lb)   Height: 5\' 9"  (1.753 m)    Eyes: PERRL, lids and conjunctivae normal Head: normocephalic Nose and ears: no deformities.  ENMT: Mucous membranes are moist. Posterior pharynx clear of any exudate or lesions.Normal dentition.  Neck: normal, supple, no masses, no thyromegaly Respiratory: clear to auscultation bilaterally, no wheezing, no crackles. Normal respiratory effort. No accessory muscle use.  Cardiovascular: Regular rate and rhythm, no murmurs / rubs / gallops. No extremity edema.  2+ pedal pulses. No carotid bruits.  Abdomen: no tenderness, no masses palpated. No hepatosplenomegaly. Bowel sounds positive.  Musculoskeletal: no clubbing / cyanosis. No joint deformity upper and lower extremities. Good ROM, no contractures. Normal muscle tone.  Left upper extremity, with edema and erythema at the dorsal region of the wrist and hand, with decreased range of motion to extension or fingers abduction. Erythema and edema extending into her distal forearm on the left Skin: no rashes,  lesions, ulcers. No induration Neurologic: CN 2-12 grossly intact. Sensation intact, DTR normal. Strength 5/5 in all 4.   Labs on Admission: I have personally reviewed following labs and imaging studies  CBC:  Recent Labs Lab 02/18/17 0752  WBC 7.6  NEUTROABS 4.9  HGB 12.6  HCT 38.2  MCV 82.0  PLT 197   Basic Metabolic Panel:  Recent Labs Lab 02/18/17 0752  NA 139  K 3.8  CL 105  CO2 26  GLUCOSE 102*  BUN 19  CREATININE 0.76  CALCIUM 9.2   GFR: Estimated Creatinine Clearance: 92.8 mL/min (by C-G formula based on SCr of 0.76 mg/dL). Liver Function Tests: No results for input(s): AST, ALT, ALKPHOS, BILITOT, PROT, ALBUMIN in the last 168 hours. No results for input(s): LIPASE, AMYLASE in the last 168 hours. No results for input(s): AMMONIA in the last 168 hours. Coagulation Profile: No results for input(s): INR, PROTIME in the last 168 hours. Cardiac Enzymes: No results for input(s): CKTOTAL, CKMB, CKMBINDEX, TROPONINI in the last 168 hours. BNP (last 3 results) No results for input(s): PROBNP in the last 8760 hours. HbA1C: No results for input(s): HGBA1C in the last 72 hours. CBG: No results for input(s): GLUCAP in the last 168 hours. Lipid Profile: No results for input(s): CHOL, HDL, LDLCALC, TRIG, CHOLHDL, LDLDIRECT in the last 72 hours. Thyroid Function Tests: No results for input(s): TSH, T4TOTAL, FREET4, T3FREE, THYROIDAB in the last 72 hours. Anemia Panel: No results for input(s): VITAMINB12, FOLATE, FERRITIN, TIBC, IRON, RETICCTPCT in the last 72 hours. Urine analysis:    Component Value Date/Time   COLORURINE YELLOW 08/16/2015 1121   APPEARANCEUR CLEAR 08/16/2015 1121   LABSPEC 1.010 08/16/2015 1121   PHURINE 7.5 08/16/2015 1121   GLUCOSEU NEGATIVE 08/16/2015 1121   HGBUR NEGATIVE 08/16/2015 1121   BILIRUBINUR NEGATIVE 08/16/2015 1121   KETONESUR NEGATIVE 08/16/2015 1121   PROTEINUR NEGATIVE 08/16/2015 1121   UROBILINOGEN 0.2 08/15/2011 1546    NITRITE NEGATIVE 08/16/2015 1121   LEUKOCYTESUR NEGATIVE 08/16/2015 1121    Radiological Exams on Admission: Dg Hand Complete Left  Result Date: 02/18/2017 CLINICAL DATA:  Left hand swelling after wasp sting 02/16/2017. Initial encounter. EXAM: LEFT HAND - COMPLETE 3+ VIEW COMPARISON:  None. FINDINGS: Generalized soft tissue swelling. No soft tissue emphysema, opaque foreign body, or osseous abnormality. IMPRESSION: Soft tissue swelling without emphysema, opaque foreign body, or osseous abnormality. Electronically Signed   By: Marnee Spring M.D.   On: 02/18/2017 08:07    EKG: Independently reviewed. NA  Assessment/Plan Active Problems:   Cellulitis   This is a 46 year old female with no significant past medical history who experienced wasp stung about 48 hours ago on her left wrist, developing severe erythema, edema, pain and decreased range of motion. No other systemic symptoms. Try low-dose ibuprofen and Benadryl with no major improvement. Physical examination temperature 97.6, blood pressure 143/91, heart rate 58, respiratory rate 18, oxygen saturation 100% room air. Her oral mucosa was moist, her lungs were clear to auscultation bilaterally, heart S1-S2 present rhythmic, the  abdomen was soft nontender, lower extremities with no edema, her left wrist with positive edema, erythema, tender to palpation, decreased range of motion, no purulence. Sodium 138, potassium 3.8, chloride 105, bicarbonate 26, glucose 102, BUN 19, creatinine 0.76, white count 7.6, hemoglobin 12.6, hematocrit 38.2, platelets 197. Left wrist x-ray with soft tissue swelling without emphysema.   Working diagnosis left wrist erythema, edema, tenderness due to insect bite (wasp) to rule out bacterial superinfection.   1. Left wrist edema and erythema due to insect bite, rule out bacterial superinfection. Patient will be admitted under observation, will place on gentle maintenance IV fluids, anti-inflammatory therapy with  ibuprofen, antihistamine with Benadryl, will start patient on ceftriaxone intravenously. There is no signs of active joint involvement or infection. Will need close observation over the next 24 hours.  2. Elevated blood pressure. Likely reactive due to acute illness, we'll continue close monitoring. Will need outpatient follow-up.   DVT prophylaxis: enoxaparin  Code Status:  Full Family Communication: No family at bedside  Disposition Plan: Home  Consults called: Admission status: Observation  Marilyn Holmes Annett Gulaaniel Georganna Maxson MD [facility] Pager [phone]- [phone]  If 7PM-7AM, please contact night-coverage [email or web address] Password [email or web address]  02/18/2017, 10:09 AM

## 2017-02-18 NOTE — ED Notes (Signed)
Left hand edema and erythema extending into left forearm.

## 2017-02-18 NOTE — ED Triage Notes (Signed)
Pt states she was stung by at least 4 wasps on left hand.  Having swelling and redness with blisters in hand that is progressing down arm

## 2017-02-19 DIAGNOSIS — T63461A Toxic effect of venom of wasps, accidental (unintentional), initial encounter: Principal | ICD-10-CM

## 2017-02-19 LAB — CBC
HCT: 32 % — ABNORMAL LOW (ref 36.0–46.0)
Hemoglobin: 10.6 g/dL — ABNORMAL LOW (ref 12.0–15.0)
MCH: 27.2 pg (ref 26.0–34.0)
MCHC: 33.1 g/dL (ref 30.0–36.0)
MCV: 82.3 fL (ref 78.0–100.0)
Platelets: 170 10*3/uL (ref 150–400)
RBC: 3.89 MIL/uL (ref 3.87–5.11)
RDW: 14.4 % (ref 11.5–15.5)
WBC: 6.4 10*3/uL (ref 4.0–10.5)

## 2017-02-19 LAB — HIV ANTIBODY (ROUTINE TESTING W REFLEX): HIV SCREEN 4TH GENERATION: NONREACTIVE

## 2017-02-19 LAB — COMPREHENSIVE METABOLIC PANEL
ALBUMIN: 3.3 g/dL — AB (ref 3.5–5.0)
ALK PHOS: 66 U/L (ref 38–126)
ALT: 30 U/L (ref 14–54)
AST: 23 U/L (ref 15–41)
Anion gap: 7 (ref 5–15)
BILIRUBIN TOTAL: 0.5 mg/dL (ref 0.3–1.2)
BUN: 15 mg/dL (ref 6–20)
CALCIUM: 8.2 mg/dL — AB (ref 8.9–10.3)
CO2: 23 mmol/L (ref 22–32)
CREATININE: 0.67 mg/dL (ref 0.44–1.00)
Chloride: 107 mmol/L (ref 101–111)
GFR calc Af Amer: >60 mL/min (ref >60)
GFR calc non Af Amer: >60 mL/min (ref >60)
GLUCOSE: 104 mg/dL — AB (ref 65–99)
Potassium: 4 mmol/L (ref 3.5–5.1)
Sodium: 137 mmol/L (ref 135–145)
TOTAL PROTEIN: 5.8 g/dL — AB (ref 6.5–8.1)

## 2017-02-19 MED ORDER — IBUPROFEN 400 MG PO TABS: 400.0000 mg | ORAL_TABLET | Freq: Three times a day (TID) | ORAL | 0 refills | Status: AC | PRN

## 2017-02-19 NOTE — Discharge Summary (Signed)
Physician Discharge Summary  Marilyn Holmes ZOX:[id number] DOB: [birth year] DOA: 02/18/2017  PCP: Ilda Mori, MD  Admit date: 02/18/2017 Discharge date: 02/19/2017  Admitted From: Home  Disposition:  Home   Recommendations for Outpatient Follow-up:  1. Follow up with PCP in 1-week 2. Take ibuprofen as needed  Home Health: No  Equipment/Devices: No   Discharge Condition: Stable  CODE STATUS: Full  Diet recommendation: Regular   Brief/Interim Summary: This is a 46 year old female with no significant past medical history who experienced wasp stung about 48 hours ago on her left wrist, developing severe erythema, edema, pain and decreased range of motion. No other systemic symptoms. She tryed low-dose ibuprofen and Benadryl with no major improvement. Physical examination temperature 97.6, blood pressure 143/91, heart rate 58, respiratory rate 18, oxygen saturation 100% room air. Her oral mucosa was moist, her lungs were clear to auscultation bilaterally, heart S1-S2 present rhythmic, the abdomen was soft nontender, lower extremities with no edema, her left wrist with positive edema, erythema, tender to palpation, decreased range of motion, no purulence. Sodium 138, potassium 3.8, chloride 105, bicarbonate 26, glucose 102, BUN 19, creatinine 0.76, white count 7.6, hemoglobin 12.6, hematocrit 38.2, platelets 197. Left wrist x-ray with soft tissue swelling without emphysema.   Working diagnosis left wrist erythema, edema, tenderness due to insect stung (wasp) to rule out bacterial superinfection.  1. Left wrist/ hand inflammation due to insect stung. Patient was admitted to the medical floor, she was placed on IV fluids and antibiotics, with IV ceftriaxone. Anti-inflammatory regimen with ibuprofen 400 mg every 6 hours and antihistamines with Benadryl. Patient had a remarkable improvement of her inflammatory changes. Erythema completely resolved, edema with significant improvement. Patient remained  afebrile, no leukocytosis. The antibiotics will be discontinued, very unlikely to have a bacterial superinfection. Will recommend follow-up as an outpatient within 7 days. Take ibuprofen as needed.  2. Elevated blood pressure. Due to acute illness, discharge blood pressure 121/72   Discharge Diagnoses:  Active Problems:   Cellulitis    Discharge Instructions   Allergies as of 02/19/2017   No Known Allergies     Medication List    TAKE these medications   CRANBERRY/PROBIOTIC/VIT C PO Take 1 tablet by mouth daily.   ibuprofen 400 MG tablet Commonly known as:  ADVIL,MOTRIN Take 1 tablet (400 mg total) by mouth every 8 (eight) hours as needed (as needed for pain.). What changed:  medication strength  how much to take  when to take this  reasons to take this       No Known Allergies  Consultations:     Procedures/Studies: Dg Hand Complete Left  Result Date: 02/18/2017 CLINICAL DATA:  Left hand swelling after wasp sting 02/16/2017. Initial encounter. EXAM: LEFT HAND - COMPLETE 3+ VIEW COMPARISON:  None. FINDINGS: Generalized soft tissue swelling. No soft tissue emphysema, opaque foreign body, or osseous abnormality. IMPRESSION: Soft tissue swelling without emphysema, opaque foreign body, or osseous abnormality. Electronically Signed   By: Marnee Spring M.D.   On: 02/18/2017 08:07   US Breast Ltd Uni Left Inc Axilla  Result Date: 02/03/2017 CLINICAL DATA:  Screening recall for a possible left breast mass. EXAM: 2D DIGITAL DIAGNOSTIC LEFT MAMMOGRAM WITH CAD AND ADJUNCT TOMO ULTRASOUND LEFT BREAST COMPARISON:  Previous exam(s). ACR Breast Density Category c: The breast tissue is heterogeneously dense, which may obscure small masses. FINDINGS: The possible mass persists on the spot-compression 2D and 3D images. It is oval and circumscribed lying in the posterior lower inner  quadrant of the left breast. Mammographic images were processed with CAD. On physical exam, no mass  is palpated along the lower inner aspect of the left breast. Targeted ultrasound is performed, showing a subtle area of slight decreased echogenicity, only appreciated with harmonic imaging, in the posterior aspect of the left breast at the 8:30 o'clock position, 5 cm the nipple, measuring 10 x 6 x 8 mm. The shape and size and position with lesion correlates with the mammographic finding. Sonographic evaluation of the left axilla shows no enlarged or abnormal lymph nodes. IMPRESSION: 1. Suspicious mass in the posterior lower inner left breast. Although the imaging characteristics of this mass suggests that it is benign, it appears new from prior mammograms. Biopsy is recommended. RECOMMENDATION: 1. Stereotactic core needle biopsy is recommended and will be scheduled. Although the lesion is seen subtly on ultrasound, it is somewhat difficult to reproduce. Mammographic guidance is preferred, since this is readily visible mammographically. I have discussed the findings and recommendations with the patient. Results were also provided in writing at the conclusion of the visit. If applicable, a reminder letter will be sent to the patient regarding the next appointment. BI-RADS CATEGORY  4: Suspicious. Electronically Signed   By: Amie Portland M.D.   On: 02/03/2017 16:00   Mm Diag Breast Tomo Uni Left  Result Date: 02/03/2017 CLINICAL DATA:  Screening recall for a possible left breast mass. EXAM: 2D DIGITAL DIAGNOSTIC LEFT MAMMOGRAM WITH CAD AND ADJUNCT TOMO ULTRASOUND LEFT BREAST COMPARISON:  Previous exam(s). ACR Breast Density Category c: The breast tissue is heterogeneously dense, which may obscure small masses. FINDINGS: The possible mass persists on the spot-compression 2D and 3D images. It is oval and circumscribed lying in the posterior lower inner quadrant of the left breast. Mammographic images were processed with CAD. On physical exam, no mass is palpated along the lower inner aspect of the left breast.  Targeted ultrasound is performed, showing a subtle area of slight decreased echogenicity, only appreciated with harmonic imaging, in the posterior aspect of the left breast at the 8:30 o'clock position, 5 cm the nipple, measuring 10 x 6 x 8 mm. The shape and size and position with lesion correlates with the mammographic finding. Sonographic evaluation of the left axilla shows no enlarged or abnormal lymph nodes. IMPRESSION: 1. Suspicious mass in the posterior lower inner left breast. Although the imaging characteristics of this mass suggests that it is benign, it appears new from prior mammograms. Biopsy is recommended. RECOMMENDATION: 1. Stereotactic core needle biopsy is recommended and will be scheduled. Although the lesion is seen subtly on ultrasound, it is somewhat difficult to reproduce. Mammographic guidance is preferred, since this is readily visible mammographically. I have discussed the findings and recommendations with the patient. Results were also provided in writing at the conclusion of the visit. If applicable, a reminder letter will be sent to the patient regarding the next appointment. BI-RADS CATEGORY  4: Suspicious. Electronically Signed   By: Amie Portland M.D.   On: 02/03/2017 16:00   Mm Clip Placement Left  Result Date: 02/06/2017 CLINICAL DATA:  Status post stereotactic biopsy for a left breast mass. EXAM: DIAGNOSTIC LEFT MAMMOGRAM POST STEREOTACTIC BIOPSY COMPARISON:  Previous exam(s). FINDINGS: Mammographic images were obtained following stereotactic guided biopsy of the mass within the lower inner quadrant of the left breast. At the conclusion of the procedure CT coil shaped biopsy clip was placed at the biopsy site. Biopsy clip appears appropriately positioned on the true lateral view, slightly  lateral to the biopsy site based on the CC projection. IMPRESSION: Biopsy clip adequately positioned, perhaps slightly lateral to the biopsy site based on the CC projection (1 to 1-1/2 cm).  Final Assessment: Post Procedure Mammograms for Marker Placement Electronically Signed   By: Bary RichardStan  Maynard M.D.   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 This was found to be concordant by Dr. Bary RichardStan Maynard. Pathology results were discussed with the patient by telephone. The patient reported doing well after the biopsy with tenderness at the site. Post biopsy instructions and care were reviewed and questions were answered. The patient was encouraged to call [facility] for any additional concerns. The patient was instructed to return for annual screening mammography at [facility] in [place], [place]. Pathology results reported by Rene KocherLynne Bailey, RN on 02/09/2017. Electronically Signed   By: Bary RichardStan  Maynard M.D.   On: 02/09/2017 15:20   Result Date: 02/09/2017 CLINICAL DATA:  Patient with left breast mass presents today for stereotactic biopsy. EXAM: LEFT BREAST STEREOTACTIC CORE NEEDLE BIOPSY COMPARISON:  Previous exams. FINDINGS: The patient and I discussed the procedure of stereotactic-guided biopsy including benefits and alternatives. We discussed the high likelihood of a successful procedure. We discussed the risks of the procedure including infection, bleeding, tissue injury, clip migration, and inadequate sampling. Informed written consent was given. The usual time out protocol was performed immediately prior to the procedure. Using sterile technique and 1% Lidocaine as local anesthetic, under stereotactic guidance, a 9 gauge vacuum assisted device was used to perform core needle biopsy of the mass in the lower inner quadrant of the left breast using a medial approach. Lesion quadrant: Lower inner quadrant At the conclusion of the procedure, a coil shaped tissue  marker clip was deployed into the biopsy cavity. Follow-up 2-view mammogram was performed and dictated separately. IMPRESSION: Stereotactic-guided biopsy of the mass within the lower inner quadrant of the left breast. No apparent complications. Electronically Signed: By: Bary RichardStan  Maynard M.D. On: 02/06/2017 11:25       Subjective: Patient feeling better, left wrist edema and erythema with significant improvement, recovered range of motion. No fever or chills.   Discharge Exam: Vitals:   02/18/17 2256 02/19/17 0514  BP: 121/72 97/75  Pulse: 66 85  Resp: 18 18  Temp: 97.6 F (36.4 C) 98.3 F (36.8 C)   Vitals:   02/18/17 0938 02/18/17 1018 02/18/17 2256 02/19/17 0514  BP: (!) 136/91 (!) 143/91 121/72 97/75  Pulse: 70 (!) 58 66 85  Resp: 18 18 18 18   Temp: 97.7 F (36.5 C) 97.6 F (36.4 C) 97.6 F (36.4 C) 98.3 F (36.8 C)  TempSrc:  Oral Oral Oral  SpO2: 100% 100% 100% 100%  Weight:  74.8 kg (165 lb)    Height:  5\' 9"  (1.753 m)      General: Pt is alert, awake, not in acute distress Cardiovascular: RRR, S1/S2 +, no rubs, no gallops Respiratory: CTA bilaterally, no wheezing, no rhonchi Abdominal: Soft, NT, ND, bowel sounds + Extremities: no edema, no cyanosis Left wrist with no erythema, recovered range of motion, very mild edema on the dorsum of the hand.     The results of significant diagnostics from this hospitalization (including imaging, microbiology, ancillary and laboratory) are listed below  for reference.     Microbiology: No results found for this or any previous visit (from the past 240 hour(s)).   Labs: BNP (last 3 results) No results for input(s): BNP in the last 8760 hours. Basic Metabolic Panel:  Recent Labs Lab 02/18/17 0752 02/19/17 0515  NA 139 137  K 3.8 4.0  CL 105 107  CO2 26 23  GLUCOSE 102* 104*  BUN 19 15  CREATININE 0.76 0.67  CALCIUM 9.2 8.2*   Liver Function Tests:  Recent Labs Lab 02/19/17 0515  AST 23  ALT 30  ALKPHOS  66  BILITOT 0.5  PROT 5.8*  ALBUMIN 3.3*   No results for input(s): LIPASE, AMYLASE in the last 168 hours. No results for input(s): AMMONIA in the last 168 hours. CBC:  Recent Labs Lab 02/18/17 0752 02/19/17 0515  WBC 7.6 6.4  NEUTROABS 4.9  --   HGB 12.6 10.6*  HCT 38.2 32.0*  MCV 82.0 82.3  PLT 197 170   Cardiac Enzymes: No results for input(s): CKTOTAL, CKMB, CKMBINDEX, TROPONINI in the last 168 hours. BNP: Invalid input(s): POCBNP CBG: No results for input(s): GLUCAP in the last 168 hours. D-Dimer No results for input(s): DDIMER in the last 72 hours. Hgb A1c No results for input(s): HGBA1C in the last 72 hours. Lipid Profile No results for input(s): CHOL, HDL, LDLCALC, TRIG, CHOLHDL, LDLDIRECT in the last 72 hours. Thyroid function studies No results for input(s): TSH, T4TOTAL, T3FREE, THYROIDAB in the last 72 hours.  Invalid input(s): FREET3 Anemia work up No results for input(s): VITAMINB12, FOLATE, FERRITIN, TIBC, IRON, RETICCTPCT in the last 72 hours. Urinalysis    Component Value Date/Time   COLORURINE YELLOW 08/16/2015 1121   APPEARANCEUR CLEAR 08/16/2015 1121   LABSPEC 1.010 08/16/2015 1121   PHURINE 7.5 08/16/2015 1121   GLUCOSEU NEGATIVE 08/16/2015 1121   HGBUR NEGATIVE 08/16/2015 1121   BILIRUBINUR NEGATIVE 08/16/2015 1121   KETONESUR NEGATIVE 08/16/2015 1121   PROTEINUR NEGATIVE 08/16/2015 1121   UROBILINOGEN 0.2 08/15/2011 1546   NITRITE NEGATIVE 08/16/2015 1121   LEUKOCYTESUR NEGATIVE 08/16/2015 1121   Sepsis Labs Invalid input(s): PROCALCITONIN,  WBC,  LACTICIDVEN Microbiology No results found for this or any previous visit (from the past 240 hour(s)).   Time coordinating discharge: Over 45 minutes  SIGNED:   Coralie Keens, MD  [facility] 02/19/2017, 9:25 AM Pager   If 7PM-7AM, please contact night-coverage [email or web address] Password [email or web address]

## 2017-02-19 NOTE — Progress Notes (Signed)
Pt discharged in stable condition via wheelchair into the care of her family via private vehicle.  PIV removed intact w/o S&S of complications.  Discharge instructions reviewed with pt.  Pt verbalized understanding.

## 2019-03-15 ENCOUNTER — Other Ambulatory Visit: Payer: Self-pay | Admitting: Obstetrics and Gynecology

## 2019-03-15 DIAGNOSIS — R928 Other abnormal and inconclusive findings on diagnostic imaging of breast: Secondary | ICD-10-CM

## 2019-03-16 ENCOUNTER — Ambulatory Visit
Admission: RE | Admit: 2019-03-16 | Discharge: 2019-03-16 | Disposition: A | Discharge status: Home / Self Care | Payer: BLUE CROSS/BLUE SHIELD | Source: Ambulatory Visit | Attending: Obstetrics and Gynecology | Admitting: Obstetrics and Gynecology

## 2019-03-16 ENCOUNTER — Other Ambulatory Visit: Payer: Self-pay

## 2019-03-16 ENCOUNTER — Other Ambulatory Visit: Payer: Self-pay | Admitting: Obstetrics and Gynecology

## 2019-03-16 DIAGNOSIS — R928 Other abnormal and inconclusive findings on diagnostic imaging of breast: Secondary | ICD-10-CM

## 2019-03-23 ENCOUNTER — Other Ambulatory Visit: Payer: Self-pay | Admitting: Obstetrics and Gynecology

## 2019-06-17 ENCOUNTER — Other Ambulatory Visit: Payer: Self-pay | Admitting: *Deleted

## 2019-06-17 DIAGNOSIS — Z20822 Contact with and (suspected) exposure to covid-19: Secondary | ICD-10-CM

## 2019-06-20 LAB — NOVEL CORONAVIRUS, NAA: SARS-CoV-2, NAA: NOT DETECTED

## 2019-09-21 ENCOUNTER — Ambulatory Visit
Admission: RE | Admit: 2019-09-21 | Discharge: 2019-09-21 | Disposition: A | Discharge status: Home / Self Care | Payer: BC Managed Care – PPO | Source: Ambulatory Visit | Attending: Obstetrics and Gynecology | Admitting: Obstetrics and Gynecology

## 2019-09-21 ENCOUNTER — Other Ambulatory Visit: Payer: Self-pay | Admitting: Obstetrics and Gynecology

## 2019-09-21 ENCOUNTER — Other Ambulatory Visit: Payer: Self-pay

## 2019-09-21 DIAGNOSIS — N6310 Unspecified lump in the right breast, unspecified quadrant: Secondary | ICD-10-CM

## 2019-09-21 DIAGNOSIS — R928 Other abnormal and inconclusive findings on diagnostic imaging of breast: Secondary | ICD-10-CM

## 2020-03-26 ENCOUNTER — Ambulatory Visit
Admission: RE | Admit: 2020-03-26 | Discharge: 2020-03-26 | Disposition: A | Discharge status: Home / Self Care | Payer: BC Managed Care – PPO | Source: Ambulatory Visit | Attending: Obstetrics and Gynecology | Admitting: Obstetrics and Gynecology

## 2020-03-26 ENCOUNTER — Other Ambulatory Visit: Payer: Self-pay

## 2020-03-26 DIAGNOSIS — N6310 Unspecified lump in the right breast, unspecified quadrant: Secondary | ICD-10-CM

## 2021-03-27 ENCOUNTER — Other Ambulatory Visit: Payer: Self-pay | Admitting: Obstetrics and Gynecology

## 2021-03-27 DIAGNOSIS — R928 Other abnormal and inconclusive findings on diagnostic imaging of breast: Secondary | ICD-10-CM

## 2021-05-23 ENCOUNTER — Ambulatory Visit
Admission: RE | Admit: 2021-05-23 | Discharge: 2021-05-23 | Disposition: A | Discharge status: Home / Self Care | Payer: BC Managed Care – PPO | Source: Ambulatory Visit | Attending: Obstetrics and Gynecology | Admitting: Obstetrics and Gynecology

## 2021-05-23 ENCOUNTER — Other Ambulatory Visit: Payer: Self-pay

## 2021-05-23 DIAGNOSIS — R928 Other abnormal and inconclusive findings on diagnostic imaging of breast: Secondary | ICD-10-CM

## 2023-03-31 ENCOUNTER — Encounter: Payer: Self-pay | Admitting: Physician Assistant

## 2023-04-10 ENCOUNTER — Other Ambulatory Visit: Payer: Self-pay

## 2023-04-17 ENCOUNTER — Ambulatory Visit: Payer: BC Managed Care – PPO | Admitting: Gastroenterology

## 2023-04-17 ENCOUNTER — Encounter: Payer: Self-pay | Admitting: Gastroenterology

## 2023-04-17 VITALS — BP 104/74 | HR 88 | Ht 69.0 in | Wt 152.0 lb

## 2023-04-17 DIAGNOSIS — K6289 Other specified diseases of anus and rectum: Secondary | ICD-10-CM

## 2023-04-17 DIAGNOSIS — K602 Anal fissure, unspecified: Secondary | ICD-10-CM

## 2023-04-17 DIAGNOSIS — Z800 Family history of malignant neoplasm of digestive organs: Secondary | ICD-10-CM | POA: Diagnosis not present

## 2023-04-17 MED ORDER — CIPROFLOXACIN HCL 500 MG PO TABS: 500.0000 mg | ORAL_TABLET | Freq: Two times a day (BID) | ORAL | 0 refills | Status: DC

## 2023-04-17 MED ORDER — METRONIDAZOLE 500 MG PO TABS: 500.0000 mg | ORAL_TABLET | Freq: Two times a day (BID) | ORAL | 0 refills | Status: DC

## 2023-04-17 MED ORDER — CIPROFLOXACIN HCL 500 MG PO TABS: 500.0000 mg | ORAL_TABLET | Freq: Two times a day (BID) | ORAL | 0 refills | Status: AC

## 2023-04-17 MED ORDER — METRONIDAZOLE 500 MG PO TABS: 500.0000 mg | ORAL_TABLET | Freq: Two times a day (BID) | ORAL | 0 refills | Status: AC

## 2023-04-17 MED ORDER — AMBULATORY NON FORMULARY MEDICATION: 1 refills | Status: AC

## 2023-04-17 NOTE — Patient Instructions (Addendum)
_______________________________________________________  If your blood pressure at your visit was 140/90 or greater, please contact your primary care physician to follow up on this.  _______________________________________________________  If you are age 52 or older, your body mass index should be between 23-30. Your Body mass index is 22.45 kg/m. If this is out of the aforementioned range listed, please consider follow up with your Primary Care Provider.  If you are age 2 or younger, your body mass index should be between 19-25. Your Body mass index is 22.45 kg/m. If this is out of the aformentioned range listed, please consider follow up with your Primary Care Provider.   ________________________________________________________  The [facility] providers would like to encourage you to use [facility] to communicate with providers for non-urgent requests or questions.  Due to long hold times on the telephone, sending your provider a message by [facility] may be a faster and more efficient way to get a response.  Please allow 48 business hours for a response.  Please remember that this is for non-urgent requests.  _______________________________________________________  Your provider has prescribed Nitroglycerin ointment for you. Please follow the directions written on your prescription bottle or given to you specifically by your provider. Since this is a specialty medication and is not readily available at most local pharmacies, we have sent your prescription to:  [facility] information is below: Address: [address] [place], [place] [address]  [phone]  *Please DO NOT go directly from our office to pick up this medication! Give the pharmacy 1 day to process the prescription as this is compounded and takes time to make.  Purchase over the counter Recticare and use this three times a day.   It was a pleasure to see you today!  Thank you for trusting me with your  gastrointestinal care!

## 2023-04-17 NOTE — Progress Notes (Signed)
[facility] Consult Note:  History: Marilyn Holmes 04/17/2023  Referring provider: Ilda Mori, MD  Reason for consult/chief complaint: Rectal Pain (Pt states has some tenderness with some white pus that drains and sometimes bleeds.)   Subjective  HPI: Marilyn Holmes was recently referred to [place] for perianal/rectal pain.  Referral notes indicate she was recently seen for the symptoms and had a suspected perianal infection treated with doxycycline, patient was reporting being able to expel some bloody discharge from the perianal lesion.  Referral was sent for "rectal mass".  Marilyn Holmes is a very pleasant lady who does not have chronic digestive problems other than perhaps being somewhat gassy at times.  About 3 months ago she recalls a brief episode of constipation for a few days, which was unusual for her.  That seems to be in the beginning of the symptoms, where she initially had anorectal pain that has significantly subsided since then.  That episode of constipation was not typical for her, and it then resolved and she is now having normal soft bowel movements daily without straining or pain.  However, she continues to have a feeling of pressure and a lump in the posterior anal area, says it is more noticeable when she squats down.  If she presses on the area it will periodically express some blood or pus.  She denies fever chills chest pain dyspnea abdominal pain, nausea or vomiting.  She recalls no improvement after the doxycycline course prescribed by the PCP clinic.   ROS:  Review of Systems  Constitutional:  Negative for appetite change and unexpected weight change.  HENT:  Negative for mouth sores and voice change.   Eyes:  Negative for pain and redness.  Respiratory:  Negative for cough and shortness of breath.   Cardiovascular:  Negative for chest pain and palpitations.  Genitourinary:  Negative for dysuria and hematuria.  Musculoskeletal:  Negative for arthralgias  and myalgias.  Skin:  Negative for pallor and rash.  Neurological:  Negative for weakness and headaches.  Hematological:  Negative for adenopathy.     Past Medical History: Past Medical History:  Diagnosis Date   Ovarian cyst    Urinary tract infection      Past Surgical History: Past Surgical History:  Procedure Laterality Date   BREAST BIOPSY Left 02/09/2017   FRACTURE SURGERY     repair of arm fx (bilateral), fr nose   TONSILLECTOMY     TUBAL LIGATION     WISDOM TOOTH EXTRACTION       Family History: Family History  Problem Relation Age of Onset   Breast cancer Mother        late 91s   Diabetes Mother    CVA Father    Hypertension Father    Colon cancer Brother    Breast cancer Maternal Grandmother        74s   Diabetes Maternal Grandmother    Liver disease Neg Hx    Esophageal cancer Neg Hx      Social History: Social History   Socioeconomic History   Marital status: Married    Spouse name: Not on file   Number of children: 5   Years of education: Not on file   Highest education level: Not on file  Occupational History   Occupation: retired  Tobacco Use   Smoking status: Never   Smokeless tobacco: Never  Vaping Use   Vaping status: Never Used  Substance and Sexual Activity   Alcohol use: Yes  Comment: weekend   Drug use: No   Sexual activity: Yes    Birth control/protection: Surgical  Other Topics Concern   Not on file  Social History Narrative   Not on file   Social Determinants of Health   Financial Resource Strain: Not on file  Food Insecurity: Not on file  Transportation Needs: Not on file  Physical Activity: Not on file  Stress: Not on file  Social Connections: Not on file    Allergies: No Known Allergies  Outpatient Meds: Current Outpatient Medications  Medication Sig Dispense Refill   Cranberry-Vit C-Lactobacillus (CRANBERRY/PROBIOTIC/VIT C PO) Take 1 tablet by mouth daily. (Patient not taking: Reported on 04/17/2023)      ibuprofen (ADVIL,MOTRIN) 400 MG tablet Take 1 tablet (400 mg total) by mouth every 8 (eight) hours as needed (as needed for pain.). (Patient not taking: Reported on 04/17/2023) 20 tablet 0   ipratropium (ATROVENT) 0.06 % nasal spray 2 sprays by Each Nare route Three (3) times a day. (Patient not taking: Reported on 04/17/2023)     meloxicam (MOBIC) 15 MG tablet  (Patient not taking: Reported on 04/17/2023)     phenazopyridine (PYRIDIUM) 200 MG tablet  (Patient not taking: Reported on 04/17/2023)     No current facility-administered medications for this visit.      ___________________________________________________________________ Objective   Exam:  BP 104/74   Pulse 88   Ht 5\' 9"  (1.753 m)   Wt 152 lb (68.9 kg)   BMI 22.45 kg/m  Wt Readings from Last 3 Encounters:  04/17/23 152 lb (68.9 kg)  02/18/17 165 lb (74.8 kg)  08/16/15 160 lb 4.8 oz (72.7 kg)   Marilyn Holmes, CMA, present for entire visit General: Well-appearing Eyes: sclera anicteric, no redness ENT: oral mucosa moist without lesions, no cervical or supraclavicular lymphadenopathy CV: Regular without appreciable murmur, no JVD, no peripheral edema Resp: clear to auscultation bilaterally, normal RR and effort noted GI: soft, no tenderness, with active bowel sounds. No guarding or palpable organomegaly noted. Skin; warm and dry, no rash or jaundice noted Neuro: awake, alert and oriented x 3. Normal gross motor function and fluent speech Perianal exam reveals a posterior anal fissure that is just visible.  Palpable and tender on exam, no palpable internal lesions in the distal rectum/anal verge.  No blood or pus expelled from the area on DRE.  No anoscopy performed   No data for review  Assessment: Encounter Diagnoses  Name Primary?   Anal fissure Yes   Anal pain     52 year old woman with a few months of a posterior anal fissure caused by a brief episode of acute constipation.  Ongoing symptoms for few months, and  with the description of episodic purulent discharge, she most likely has a perianal soft tissue infection.  I doubt she has a pelvic abscess since she otherwise looks and feels well. No chronic digestive symptoms to suggest Crohn's disease, and this fissure does not appear Crohn's-like. Plan:  She reports bowel habits daily and without straining, stool is soft, so not adding stool softener or MiraLAX at this point.  Combination of RectiCare and nitroglycerin ointment 3 times daily  Ciprofloxacin 500 mg twice daily and metronidazole 500 mg twice daily, both for 14 days.  Follow-up clinic appointment in 4 weeks to reevaluate.  She knows this may take a few months to heal given how long it has been going on, especially with the chronic infection component. If she is not significantly improved at the time  of close follow-up, then we will obtain a pelvic MRI and perhaps obtain colorectal surgery evaluation.   Lastly, she reports that her brother had colorectal cancer diagnosed in his 64s or 30s requiring surgery.  She had a Cologuard test at some point, but she should have a screening colonoscopy.  We briefly discussed that and will readdress at after resolution of her fissure.   Thank you for the courtesy of this consult.  Please call me with any questions or concerns.  Charlie Pitter III  CC: Referring provider noted above

## 2023-05-20 ENCOUNTER — Ambulatory Visit: Payer: BC Managed Care – PPO | Admitting: Gastroenterology

## 2023-05-20 ENCOUNTER — Encounter: Payer: Self-pay | Admitting: Gastroenterology

## 2023-05-20 VITALS — BP 110/70 | HR 72 | Ht 69.0 in | Wt 151.0 lb

## 2023-05-20 DIAGNOSIS — K602 Anal fissure, unspecified: Secondary | ICD-10-CM | POA: Diagnosis not present

## 2023-05-20 DIAGNOSIS — K6289 Other specified diseases of anus and rectum: Secondary | ICD-10-CM | POA: Diagnosis not present

## 2023-05-20 NOTE — Progress Notes (Deleted)
     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  Evaluated in office 04/17/2023, treated with nitroglycerin and RectiCare as well as metronidazole and ciprofloxacin for suspected perianal infection. ***  ROS: Cardiovascular:  no chest pain Respiratory: no dyspnea  The patient's Past Medical, Family and Social History were reviewed and are on file in the EMR.  Objective:  Med list reviewed  Current Outpatient Medications:    AMBULATORY NON FORMULARY MEDICATION, Use rectally 3 times a day., Disp: 30 g, Rfl: 1   ciprofloxacin (CIPRO) 500 MG tablet, Take 1 tablet (500 mg total) by mouth 2 (two) times daily., Disp: 28 tablet, Rfl: 0   Cranberry-Vit C-Lactobacillus (CRANBERRY/PROBIOTIC/VIT C PO), Take 1 tablet by mouth daily. (Patient not taking: Reported on 04/17/2023), Disp: , Rfl:    ibuprofen (ADVIL,MOTRIN) 400 MG tablet, Take 1 tablet (400 mg total) by mouth every 8 (eight) hours as needed (as needed for pain.). (Patient not taking: Reported on 04/17/2023), Disp: 20 tablet, Rfl: 0   ipratropium (ATROVENT) 0.06 % nasal spray, 2 sprays by Each Nare route Three (3) times a day. (Patient not taking: Reported on 04/17/2023), Disp: , Rfl:    meloxicam (MOBIC) 15 MG tablet, , Disp: , Rfl:    metroNIDAZOLE (FLAGYL) 500 MG tablet, Take 1 tablet (500 mg total) by mouth 2 (two) times daily., Disp: 28 tablet, Rfl: 0   phenazopyridine (PYRIDIUM) 200 MG tablet, , Disp: , Rfl:    Vital signs in last 24 hrs: There were no vitals filed for this visit. Wt Readings from Last 3 Encounters:  04/17/23 152 lb (68.9 kg)  02/18/17 165 lb (74.8 kg)  08/16/15 160 lb 4.8 oz (72.7 kg)    Physical Exam  *** HEENT: sclera anicteric, oral mucosa moist without lesions Neck: supple, no thyromegaly, JVD or lymphadenopathy Cardiac: ***,  no peripheral edema Pulm: clear to auscultation bilaterally, normal RR  and effort noted Abdomen: soft, *** tenderness, with active bowel sounds. No guarding or palpable hepatosplenomegaly. Skin; warm and dry, no jaundice or rash  Labs:   ___________________________________________ Radiologic studies:   ____________________________________________ Other:   _____________________________________________ Assessment & Plan  Assessment: No diagnosis found.    Plan:   *** minutes were spent on this encounter (including chart review, history/exam, counseling/coordination of care, and documentation) > 50% of that time was spent on counseling and coordination of care.   Charlie Pitter III

## 2023-05-20 NOTE — Patient Instructions (Addendum)
  Please use nitroglycerin 2-3 times daily.  We have scheduled you for a follow up on 06/18/2023 at 4:00pm with Dr Myrtie Neither  _______________________________________________________  If your blood pressure at your visit was 140/90 or greater, please contact your primary care physician to follow up on this.  _______________________________________________________  If you are age 52 or older, your body mass index should be between 23-30. Your Body mass index is 22.3 kg/m. If this is out of the aforementioned range listed, please consider follow up with your Primary Care Provider.  If you are age 44 or younger, your body mass index should be between 19-25. Your Body mass index is 22.3 kg/m. If this is out of the aformentioned range listed, please consider follow up with your Primary Care Provider.   ________________________________________________________  The [facility] providers would like to encourage you to use [facility] to communicate with providers for non-urgent requests or questions.  Due to long hold times on the telephone, sending your provider a message by [facility] may be a faster and more efficient way to get a response.  Please allow 48 business hours for a response.  Please remember that this is for non-urgent requests.  _______________________________________________________ It was a pleasure to see you today!  Thank you for trusting me with your gastrointestinal care!

## 2023-05-20 NOTE — Progress Notes (Signed)
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  Evaluated in office 04/17/2023, treated with nitroglycerin and RectiCare as well as metronidazole and ciprofloxacin for suspected perianal infection. _______________  Today, she states that her anal fissure would occasionally swell. She had finished her antibiotic course, which stopped drainage and greatly reduced her pain.  But states that she only used her nitroglycerin when she was taking her antibiotics. She states that her instruction were not clear and thus she did not continue applying the nitroglycerin.    Currently, her fissure is smaller than before, pain has decreased and area is less tender. There is currently no drainage.  She still notices the tenderness and a firm protuberance when she squats down to try to examine the area with a mirror.  We also discussed postponing her screening colonoscopy till fissure is healed despite having a family history of colon cancer.   ROS: Review of Systems  Constitutional:  Negative for appetite change and fever.  HENT:  Negative for trouble swallowing.   Respiratory:  Negative for cough and shortness of breath.   Cardiovascular:  Negative for chest pain.  Gastrointestinal:  Positive for rectal pain. Negative for abdominal distention, abdominal pain, anal bleeding, blood in stool, constipation, diarrhea, nausea and vomiting.  Genitourinary:  Negative for dysuria.  Musculoskeletal:  Negative for back pain.  Skin:  Negative for rash.  Neurological:  Negative for weakness.  All other systems reviewed and are negative.    The patient's Past Medical, Family and Social History were reviewed and are on file in the  EMR.  Objective:  Med list reviewed  Current Outpatient Medications:    AMBULATORY NON FORMULARY MEDICATION, Use rectally 3 times a day., Disp: 30 g, Rfl: 1   ciprofloxacin (CIPRO) 500 MG tablet, Take 1 tablet (500 mg total) by mouth 2 (two) times daily. (Patient not taking: Reported on 05/20/2023), Disp: 28 tablet, Rfl: 0   Cranberry-Vit C-Lactobacillus (CRANBERRY/PROBIOTIC/VIT C PO), Take 1 tablet by mouth daily. (Patient not taking: Reported on 04/17/2023), Disp: , Rfl:    ibuprofen (ADVIL,MOTRIN) 400 MG tablet, Take 1 tablet (400 mg total) by mouth every 8 (eight) hours as needed (as needed for pain.). (Patient not taking: Reported on 04/17/2023), Disp: 20 tablet, Rfl: 0   ipratropium (ATROVENT) 0.06 % nasal spray, 2 sprays by Each Nare route Three (3) times a day. (Patient not taking: Reported on 04/17/2023), Disp: , Rfl:    meloxicam (MOBIC) 15 MG tablet, , Disp: , Rfl:    metroNIDAZOLE (FLAGYL) 500 MG tablet, Take 1 tablet (500 mg total) by mouth 2 (two) times daily. (Patient not taking: Reported on 05/20/2023), Disp: 28 tablet, Rfl: 0   phenazopyridine (PYRIDIUM) 200 MG tablet, , Disp: , Rfl:    Vital signs in last 24 hrs: Vitals:   05/20/23 1105  BP: 110/70  Pulse: 72   Wt Readings from Last 3 Encounters:  05/20/23 151 lb (68.5 kg)  04/17/23 152 lb (68.9 kg)  02/18/17 165 lb (74.8 kg)    Physical Exam  Well-appearing Abdomen soft nondistended nontender Posterior fissure is smaller than before, no drainage, much less tenderness.  Complete DRE otherwise normal.  Labs:  ___________________________________________ Radiologic studies:   ____________________________________________ Other:   _____________________________________________ Assessment & Plan  Assessment: Anal fissure  Anal pain  No chronic digestive symptoms to suggest Crohn's.  Bowel habits are daily and soft, so does not need stool softener or MiraLAX at this point. I believe there was a perianal  soft tissue infection from the chronicity of this fissure that is now resolved after antibiotics.  The visual takes more time to heal, perhaps up to a couple more months. Plan: -continue nitroglycerin twice daily, and RectiCare after BMs. -follow up in 4 weeks for recheck and then determine timing of colonoscopy.  If fissure still not healing on the current trajectory, possible referral to colorectal surgery.   20 minutes were spent on this encounter (including chart review, history/exam, counseling/coordination of care, and documentation) > 50% of that time was spent on counseling and coordination of care.    - Amada Jupiter, MD    [facility]   Ladona Mow M Kadhim,acting as a scribe for Charlie Pitter III, MD.,have documented all relevant documentation on the behalf of Sherrilyn Rist, MD,as directed by  Sherrilyn Rist, MD while in the presence of Sherrilyn Rist, MD.   Marvis Repress III, MD, have reviewed all documentation for this visit. The documentation on 05/20/23 for the exam, diagnosis, procedures, and orders are all accurate and complete.

## 2023-06-11 ENCOUNTER — Ambulatory Visit: Payer: BC Managed Care – PPO | Admitting: Physician Assistant

## 2023-06-18 ENCOUNTER — Ambulatory Visit: Payer: BC Managed Care – PPO | Admitting: Gastroenterology

## 2023-06-18 ENCOUNTER — Encounter: Payer: Self-pay | Admitting: Gastroenterology

## 2023-06-18 VITALS — BP 124/70 | HR 52 | Ht 69.0 in | Wt 154.0 lb

## 2023-06-18 DIAGNOSIS — Z1211 Encounter for screening for malignant neoplasm of colon: Secondary | ICD-10-CM

## 2023-06-18 DIAGNOSIS — K602 Anal fissure, unspecified: Secondary | ICD-10-CM

## 2023-06-18 NOTE — Patient Instructions (Signed)
  We have given you samples of the following medication to take: MIRALAX  _______________________________________________________  If your blood pressure at your visit was 140/90 or greater, please contact your primary care physician to follow up on this.  _______________________________________________________  If you are age 52 or older, your body mass index should be between 23-30. Your Body mass index is 22.74 kg/m. If this is out of the aforementioned range listed, please consider follow up with your Primary Care Provider.  If you are age 65 or younger, your body mass index should be between 19-25. Your Body mass index is 22.74 kg/m. If this is out of the aformentioned range listed, please consider follow up with your Primary Care Provider.   ________________________________________________________  The [facility] providers would like to encourage you to use [facility] to communicate with providers for non-urgent requests or questions.  Due to long hold times on the telephone, sending your provider a message by [facility] may be a faster and more efficient way to get a response.  Please allow 48 business hours for a response.  Please remember that this is for non-urgent requests.  _______________________________________________________ It was a pleasure to see you today!  Thank you for trusting me with your gastrointestinal care!

## 2023-06-18 NOTE — Progress Notes (Signed)
     [facility] Progress Note  Chief Complaint: Anal fissure  Subjective  History:  Marilyn Holmes reports that her anorectal pain has resolved, her bowels habits are regular.  There is no purulent drainage or bleeding.    The patient's Past Medical, Family and Social History were reviewed and are on file in the EMR. Past Medical History:  Diagnosis Date   Ovarian cyst    Urinary tract infection     Past Surgical History:  Procedure Laterality Date   BREAST BIOPSY Left 02/09/2017   FRACTURE SURGERY     repair of arm fx (bilateral), fr nose   TONSILLECTOMY     TUBAL LIGATION     WISDOM TOOTH EXTRACTION      Objective:  Med list reviewed  Current Outpatient Medications:    AMBULATORY NON FORMULARY MEDICATION, Use rectally 3 times a day., Disp: 30 g, Rfl: 1   ciprofloxacin (CIPRO) 500 MG tablet, Take 1 tablet (500 mg total) by mouth 2 (two) times daily. (Patient not taking: Reported on 06/18/2023), Disp: 28 tablet, Rfl: 0   Cranberry-Vit C-Lactobacillus (CRANBERRY/PROBIOTIC/VIT C PO), Take 1 tablet by mouth daily. (Patient not taking: Reported on 06/18/2023), Disp: , Rfl:    ibuprofen (ADVIL,MOTRIN) 400 MG tablet, Take 1 tablet (400 mg total) by mouth every 8 (eight) hours as needed (as needed for pain.). (Patient not taking: Reported on 06/18/2023), Disp: 20 tablet, Rfl: 0   ipratropium (ATROVENT) 0.06 % nasal spray, 2 sprays by Each Nare route Three (3) times a day. (Patient not taking: Reported on 06/18/2023), Disp: , Rfl:    meloxicam (MOBIC) 15 MG tablet, , Disp: , Rfl:    metroNIDAZOLE (FLAGYL) 500 MG tablet, Take 1 tablet (500 mg total) by mouth 2 (two) times daily. (Patient not taking: Reported on 06/18/2023), Disp: 28 tablet, Rfl: 0   phenazopyridine (PYRIDIUM) 200 MG tablet, , Disp: , Rfl:    Vital signs in last 24 hrs: Vitals:   06/18/23 1536  BP: 124/70  Pulse: (!) 52   Wt Readings from Last 3 Encounters:  06/18/23 154 lb (69.9 kg)  05/20/23 151 lb (68.5 kg)   04/17/23 152 lb (68.9 kg)    Physical Exam   Abdomen: soft, no tenderness, with active bowel sounds. No guarding or palpable hepatosplenomegaly. Perianal exam normal.  DRE reveals healing of the posterior fissure.  There is some palpable fibrotic tissue posteriorly  Labs:   ___________________________________________ Radiologic studies:   ____________________________________________ Other:   _____________________________________________ Assessment & Plan  Assessment: Encounter Diagnoses  Name Primary?   Anal fissure Yes   Special screening for malignant neoplasms, colon     The fissure is healed, and she was encouraged to maintain regularity with a high-fiber diet, plenty of water and periodic use of MiraLAX if needed.  She really does not have chronic constipation, but had an episode of severe constipation months ago that seems to have led to this fissure.  She can stop taking the nitroglycerin and RectiCare ointments, resuming use of them if she seems to have recurrent symptoms.  I would like to hear from her if there are interim problems, and I again encouraged her to have a screening colonoscopy.  She does wish to do so but would like to wait a few months after complete healing of this fissure, and will contact us when she is ready to schedule it.    Charlie Pitter III

## 2023-10-07 IMAGING — MG DIGITAL DIAGNOSTIC BILAT W/ TOMO W/ CAD
8 series · 8 of 24 positions shown · non-contrast
Comparison: Previous exam(s).

CLINICAL DATA: 50-year-old female for 2 year follow-up of RIGHT
breast mass and for annual bilateral mammogram.

EXAM:
DIGITAL DIAGNOSTIC BILATERAL MAMMOGRAM WITH TOMOSYNTHESIS AND CAD;
ULTRASOUND RIGHT BREAST LIMITED
TECHNIQUE: Bilateral digital diagnostic mammography and breast tomosynthesis
was performed. The images were evaluated with computer-aided
detection.; Targeted ultrasound examination of the right breast was
performed

[L MLO synth-2D]
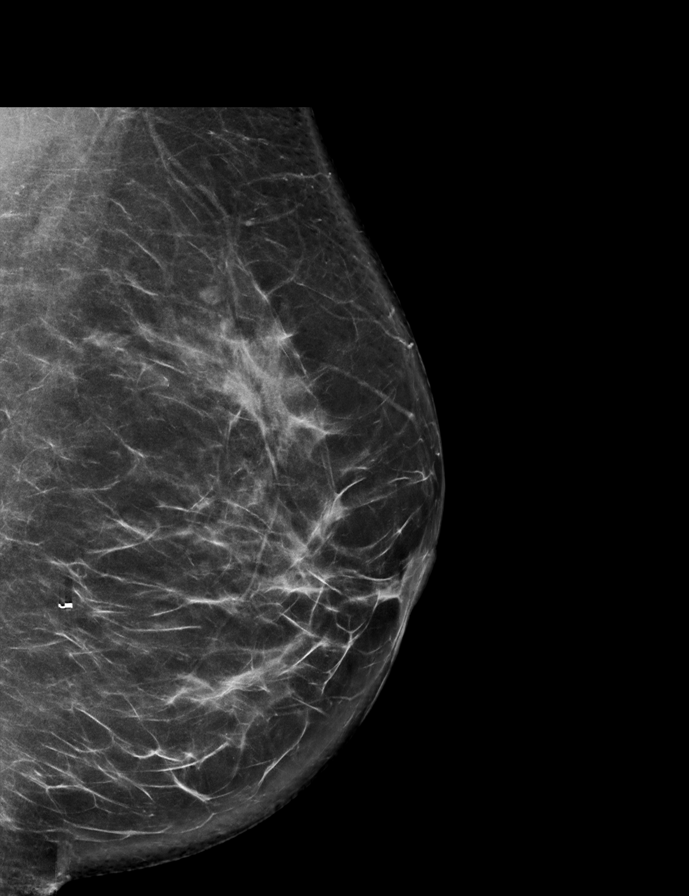

[R CC synth-2D]
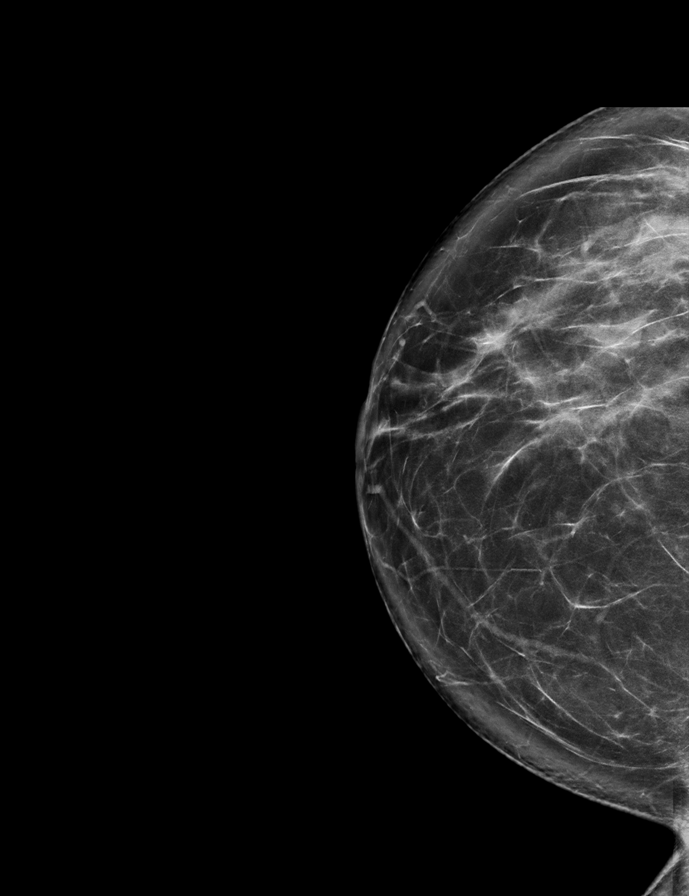

[L CC synth-2D]
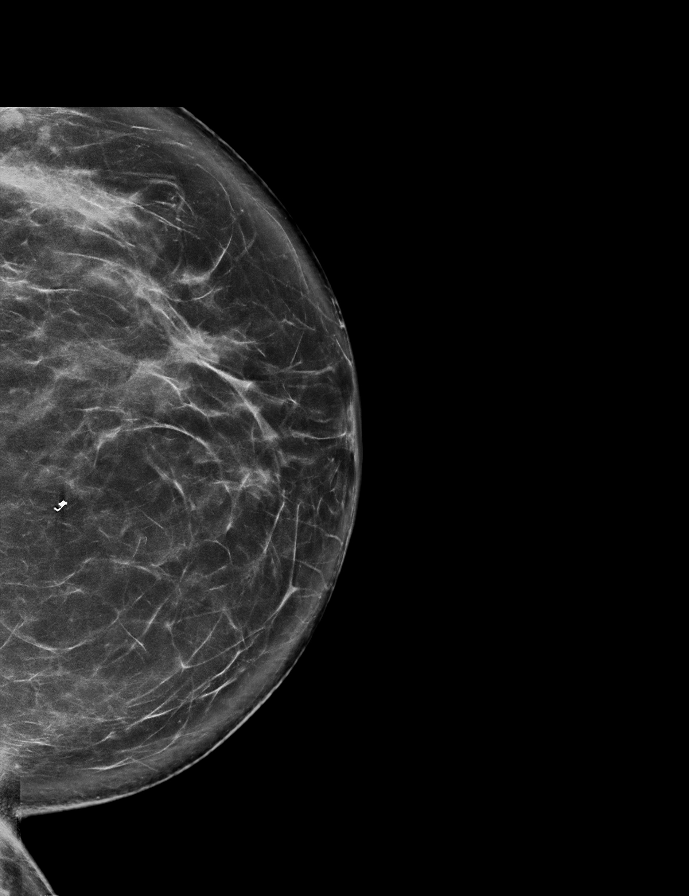

[R MLO synth-2D]
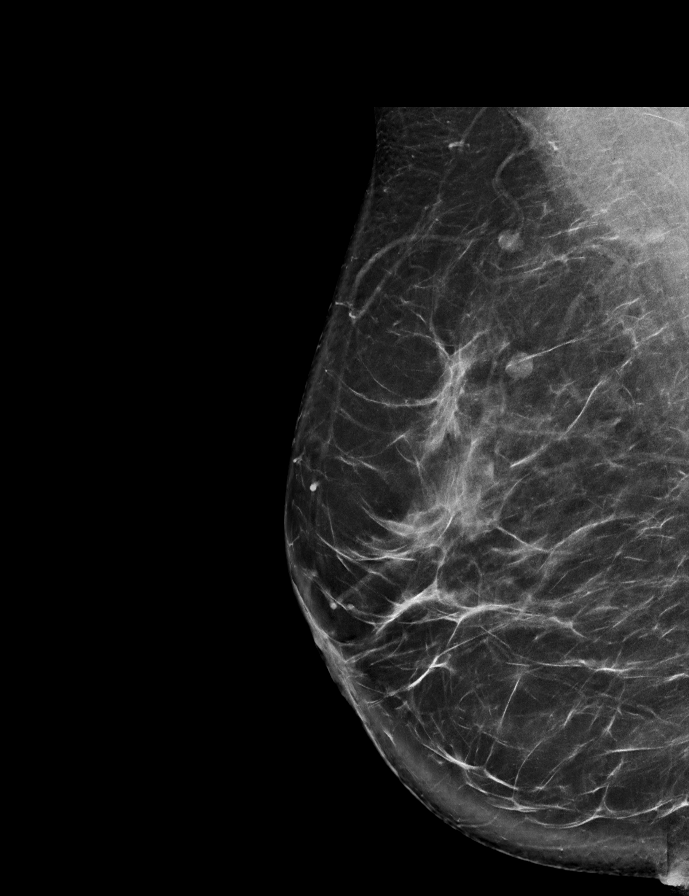

[R MLO tomo · tomo slice 45/89.0]
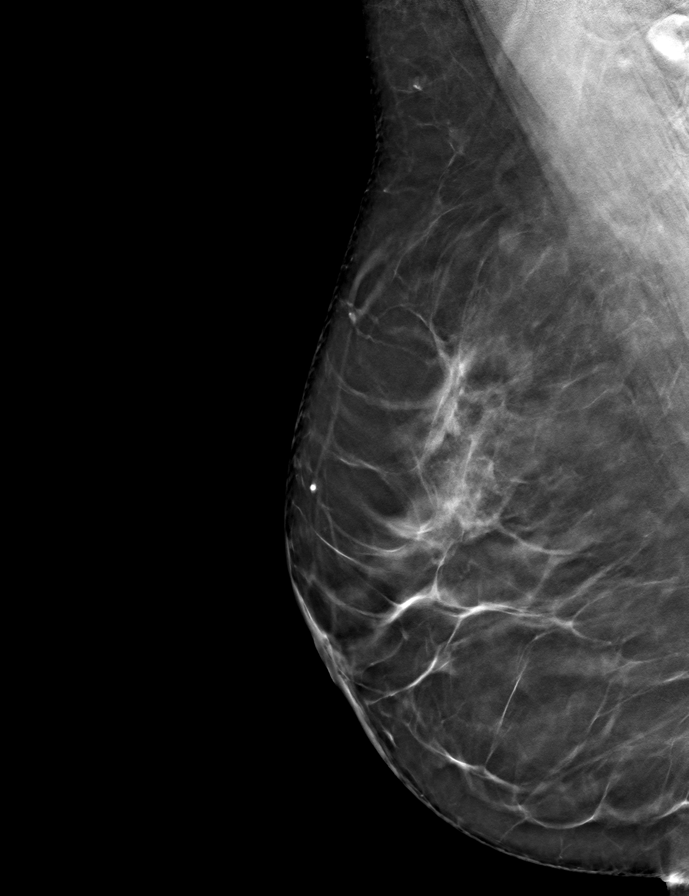

[L MLO tomo · tomo slice 43/86.0]
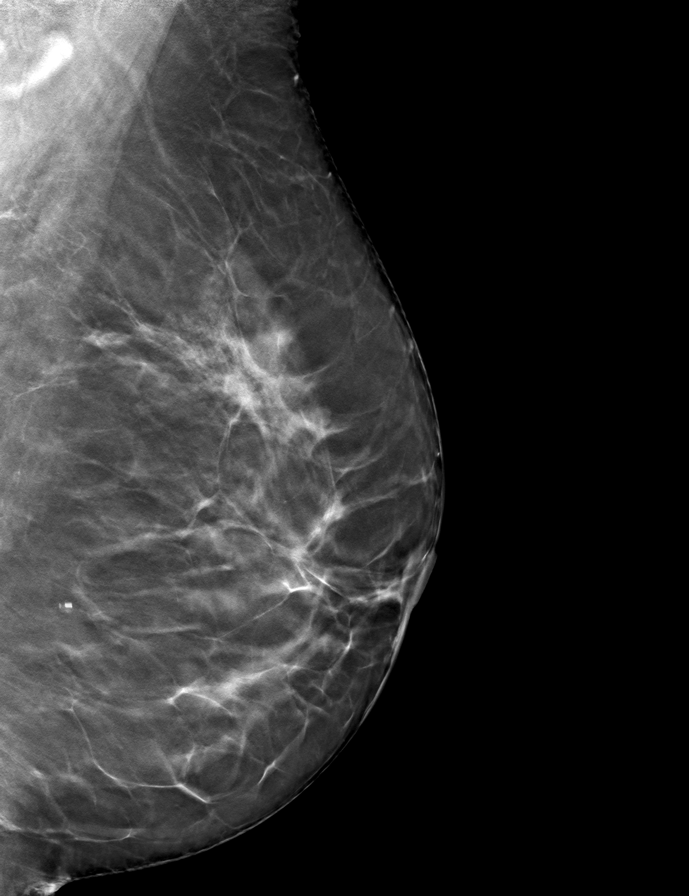

[R CC tomo · tomo slice 41/82.0]
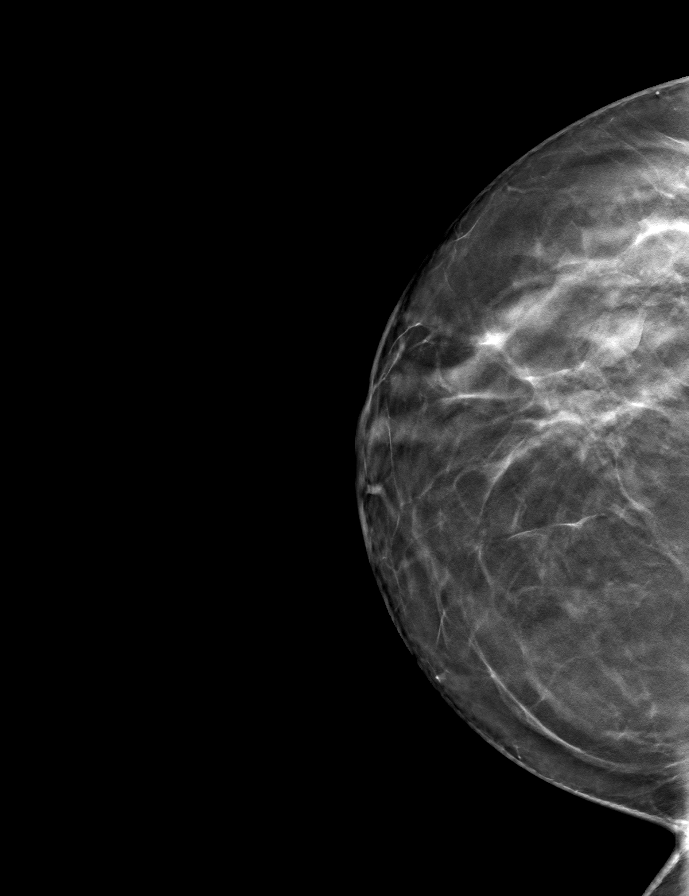

[L CC tomo · tomo slice 44/87.0]
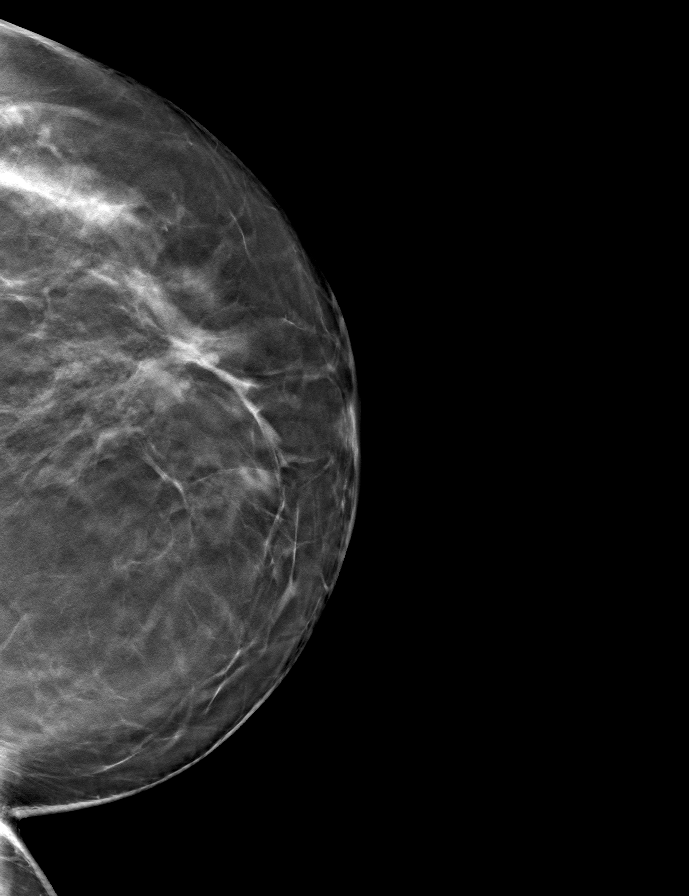

[8 of 24 positions shown; findings below may reference images not displayed]

ACR Breast Density Category c: The breast tissue is heterogeneously
dense, which may obscure small masses.
FINDINGS: 2D/3D full field views of both breasts demonstrate a stable
circumscribed oval mass within the UPPER-OUTER RIGHT breast.

No new or suspicious mammographic findings are noted within either
breast. A COIL biopsy clip within the posterior LEFT breast is again
noted.

Targeted ultrasound is performed, showing a stable 1.3 x 0.5 x
cm circumscribed oval hypoechoic parallel mass at the 10 o'clock
position of the RIGHT breast 3 cm from the nipple.
IMPRESSION: 1. Stable UPPER-OUTER RIGHT breast mass, unchanged for 2 years and
compatible with a benign etiology. No further imaging follow-up
recommended.
2. No evidence of breast malignancy.

RECOMMENDATION:
Bilateral screening mammogram in 1 year.

I have discussed the findings and recommendations with the patient.
If applicable, a reminder letter will be sent to the patient
regarding the next appointment.

BI-RADS CATEGORY  2: Benign.

## 2023-10-07 IMAGING — US US BREAST*R* LIMITED INC AXILLA
1 series · 6 of 6 positions shown · non-contrast
Comparison: Previous exam(s).

CLINICAL DATA: 50-year-old female for 2 year follow-up of RIGHT
breast mass and for annual bilateral mammogram.

EXAM:
DIGITAL DIAGNOSTIC BILATERAL MAMMOGRAM WITH TOMOSYNTHESIS AND CAD;
ULTRASOUND RIGHT BREAST LIMITED
TECHNIQUE: Bilateral digital diagnostic mammography and breast tomosynthesis
was performed. The images were evaluated with computer-aided
detection.; Targeted ultrasound examination of the right breast was
performed

[Series 1: us breast*right* limited inc axilla · 0.07mm/px · 6 of 6 slices shown]
[im 1/6]
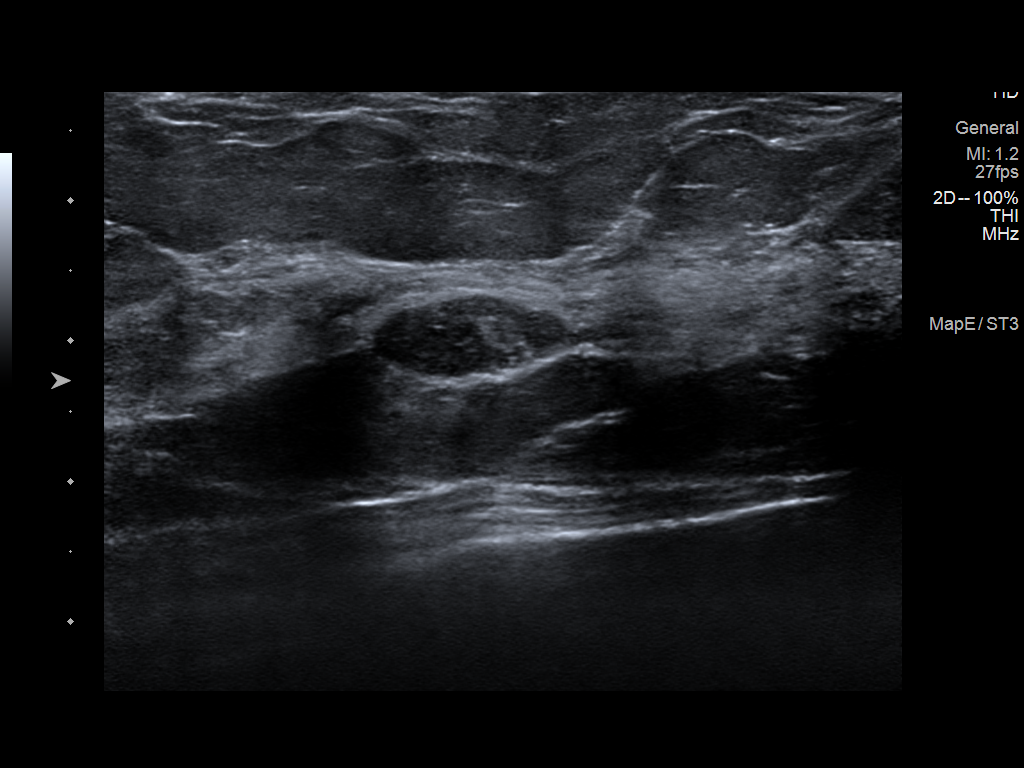
[im 2/6]
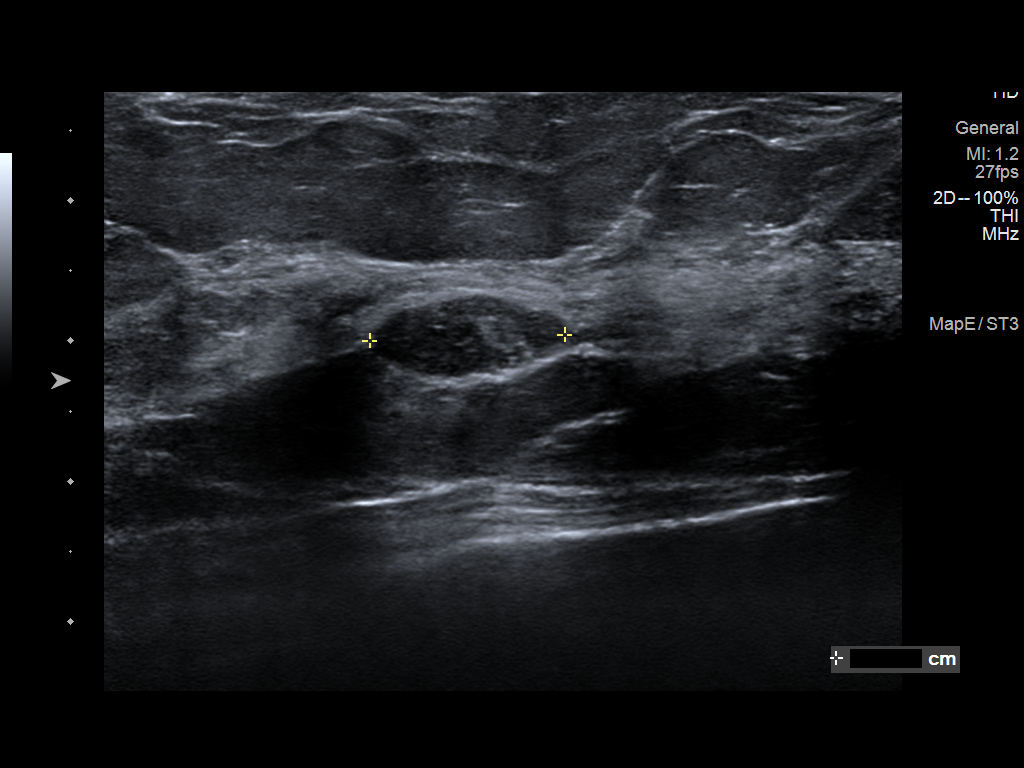
[im 3/6]
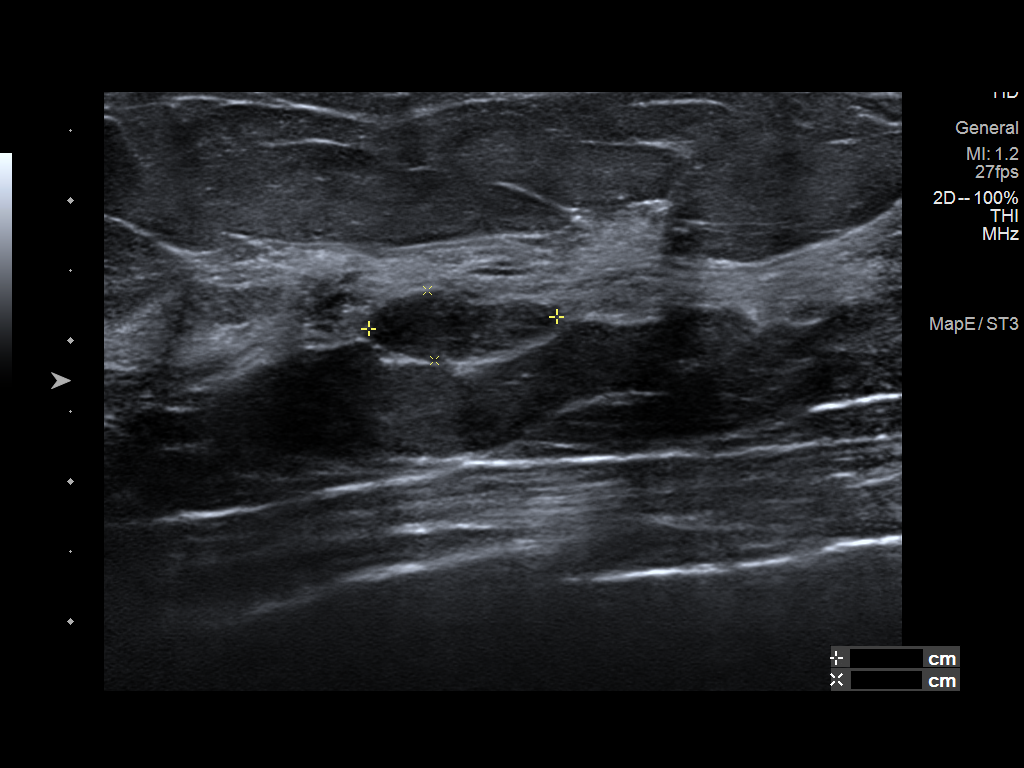
[im 4/6]
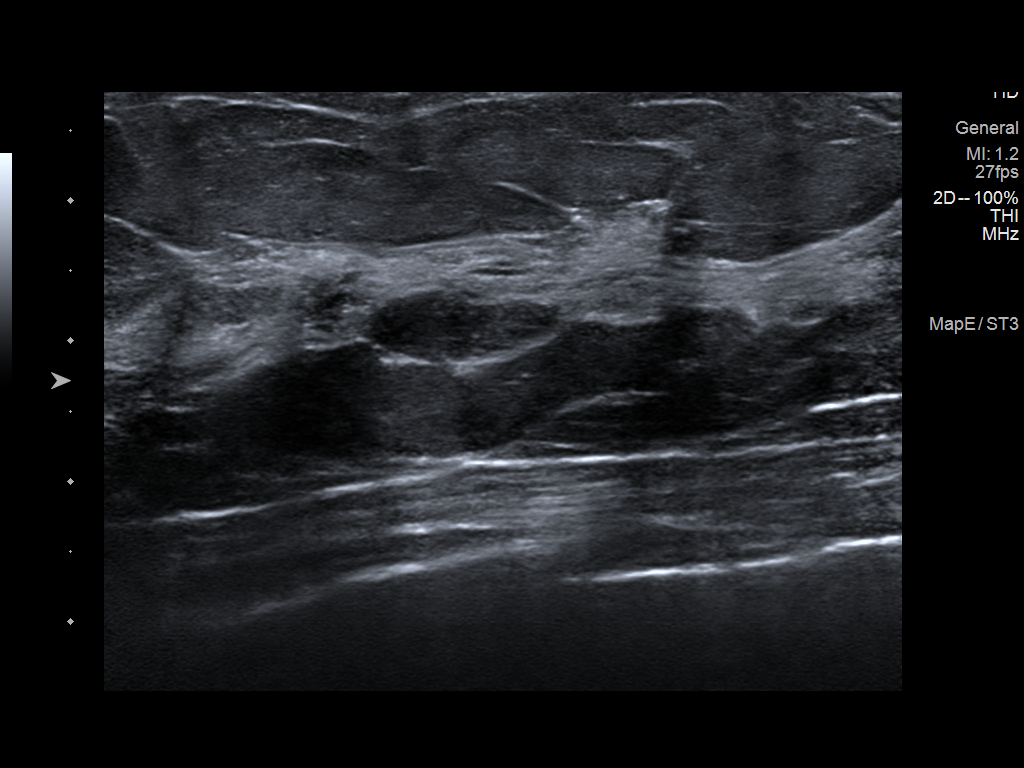
[im 5/6]
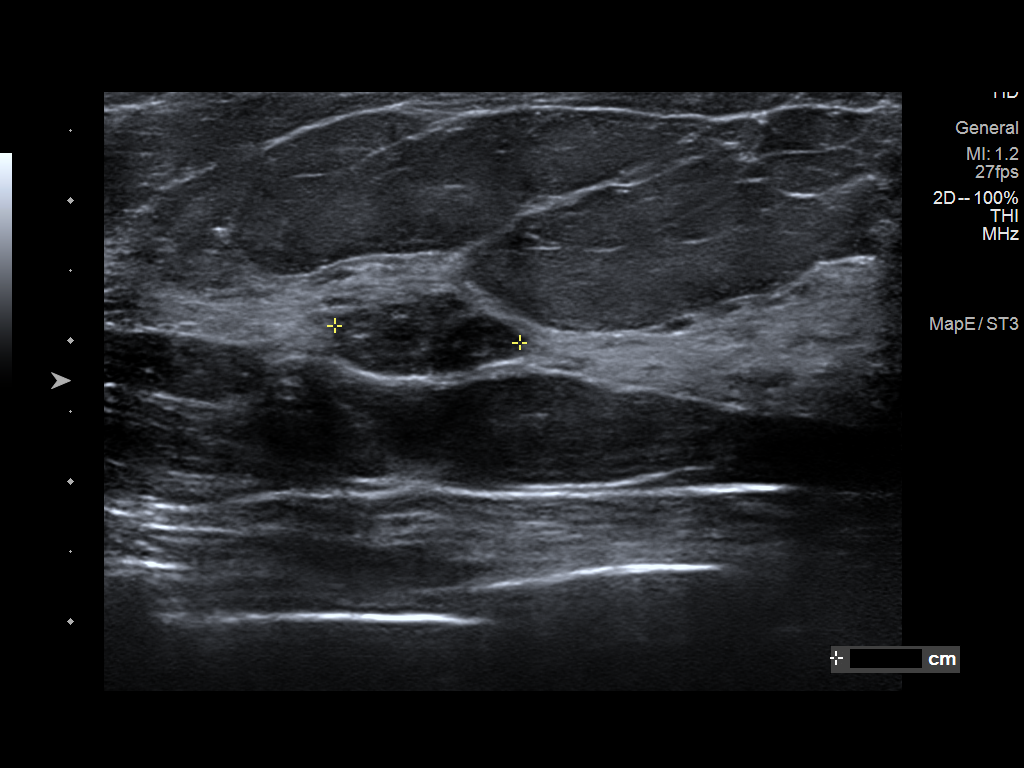
[im 6/6]
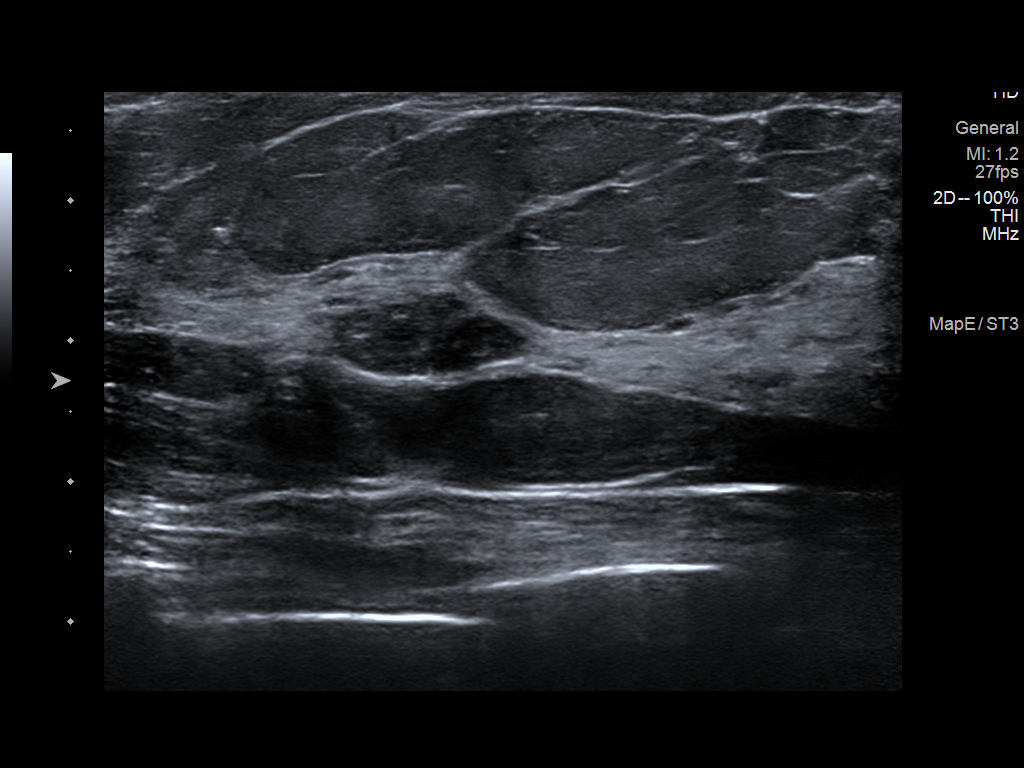

[6 of 6 positions shown; findings below may reference images not displayed]

ACR Breast Density Category c: The breast tissue is heterogeneously
dense, which may obscure small masses.
FINDINGS: 2D/3D full field views of both breasts demonstrate a stable
circumscribed oval mass within the UPPER-OUTER RIGHT breast.

No new or suspicious mammographic findings are noted within either
breast. A COIL biopsy clip within the posterior LEFT breast is again
noted.

Targeted ultrasound is performed, showing a stable 1.3 x 0.5 x
cm circumscribed oval hypoechoic parallel mass at the 10 o'clock
position of the RIGHT breast 3 cm from the nipple.
IMPRESSION: 1. Stable UPPER-OUTER RIGHT breast mass, unchanged for 2 years and
compatible with a benign etiology. No further imaging follow-up
recommended.
2. No evidence of breast malignancy.

RECOMMENDATION:
Bilateral screening mammogram in 1 year.

I have discussed the findings and recommendations with the patient.
If applicable, a reminder letter will be sent to the patient
regarding the next appointment.

BI-RADS CATEGORY  2: Benign.
